# Patient Record
Sex: Male | Born: 1990 | Race: Black or African American | Hispanic: No | Marital: Married | Smoking: Former smoker
Health system: Southern US, Community
[De-identification: ages and names within clinical notes are randomized; demographics above are authoritative.]

## PROBLEM LIST (undated history)

## (undated) DIAGNOSIS — J45909 Unspecified asthma, uncomplicated: Secondary | ICD-10-CM

## (undated) HISTORY — PX: TONSILLECTOMY: SUR1361

## (undated) HISTORY — PX: ABDOMINAL SURGERY: SHX537

---

## 2011-11-16 ENCOUNTER — Emergency Department (HOSPITAL_COMMUNITY): Payer: Medicaid Other

## 2011-11-16 ENCOUNTER — Encounter (HOSPITAL_COMMUNITY): Payer: Self-pay

## 2011-11-16 ENCOUNTER — Emergency Department (HOSPITAL_COMMUNITY)
Admission: EM | Admit: 2011-11-16 | Discharge: 2011-11-16 | Disposition: A | Discharge status: Home / Self Care | Payer: Medicaid Other | Attending: Emergency Medicine | Admitting: Emergency Medicine

## 2011-11-16 DIAGNOSIS — X58XXXA Exposure to other specified factors, initial encounter: Secondary | ICD-10-CM | POA: Insufficient documentation

## 2011-11-16 DIAGNOSIS — M25519 Pain in unspecified shoulder: Secondary | ICD-10-CM | POA: Insufficient documentation

## 2011-11-16 DIAGNOSIS — Y9383 Activity, rough housing and horseplay: Secondary | ICD-10-CM | POA: Insufficient documentation

## 2011-11-16 DIAGNOSIS — M25511 Pain in right shoulder: Secondary | ICD-10-CM

## 2011-11-16 MED ORDER — KETOROLAC TROMETHAMINE 60 MG/2ML IM SOLN
60.0000 mg | Freq: Once | INTRAMUSCULAR | Status: AC
  Administered 2011-11-16: 60 mg via INTRAMUSCULAR
  Filled 2011-11-16: qty 2

## 2011-11-16 MED ORDER — OXYCODONE-ACETAMINOPHEN 5-325 MG PO TABS: 2.0000 | ORAL_TABLET | ORAL | Status: AC | PRN

## 2011-11-16 NOTE — Progress Notes (Signed)
Orthopedic Tech Progress Note Patient Details:  Paul Marshall [birth year] [id number]  Other Ortho Devices Ortho Device Location: immobilizer sling Ortho Device Interventions: Ordered   Shawnie Pons 11/16/2011, 5:02 PM

## 2011-11-16 NOTE — ED Notes (Signed)
Ortho tech paged for sling 
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
## 2013-05-21 ENCOUNTER — Encounter (HOSPITAL_COMMUNITY): Payer: Self-pay | Admitting: Emergency Medicine

## 2013-05-21 ENCOUNTER — Emergency Department (HOSPITAL_COMMUNITY): Payer: BC Managed Care – PPO

## 2013-05-21 ENCOUNTER — Emergency Department (HOSPITAL_COMMUNITY)
Admission: EM | Admit: 2013-05-21 | Discharge: 2013-05-21 | Disposition: A | Discharge status: Home / Self Care | Payer: BC Managed Care – PPO | Attending: Emergency Medicine | Admitting: Emergency Medicine

## 2013-05-21 DIAGNOSIS — Y9289 Other specified places as the place of occurrence of the external cause: Secondary | ICD-10-CM | POA: Insufficient documentation

## 2013-05-21 DIAGNOSIS — Y9367 Activity, basketball: Secondary | ICD-10-CM | POA: Insufficient documentation

## 2013-05-21 DIAGNOSIS — S62623A Displaced fracture of medial phalanx of left middle finger, initial encounter for closed fracture: Secondary | ICD-10-CM

## 2013-05-21 DIAGNOSIS — W230XXA Caught, crushed, jammed, or pinched between moving objects, initial encounter: Secondary | ICD-10-CM | POA: Insufficient documentation

## 2013-05-21 DIAGNOSIS — F17200 Nicotine dependence, unspecified, uncomplicated: Secondary | ICD-10-CM | POA: Insufficient documentation

## 2013-05-21 DIAGNOSIS — IMO0002 Reserved for concepts with insufficient information to code with codable children: Secondary | ICD-10-CM | POA: Insufficient documentation

## 2013-05-21 MED ORDER — IBUPROFEN 400 MG PO TABS
800.0000 mg | ORAL_TABLET | Freq: Once | ORAL | Status: AC
Start: 2013-05-21 — End: 2013-05-21
  Administered 2013-05-21: 800 mg via ORAL
  Filled 2013-05-21: qty 2

## 2013-05-21 MED ORDER — OXYCODONE-ACETAMINOPHEN 5-325 MG PO TABS: 1.0000 | ORAL_TABLET | Freq: Three times a day (TID) | ORAL | Status: DC | PRN

## 2013-05-21 NOTE — ED Provider Notes (Signed)
History  This chart was scribed for Glade Nurse, PA-C working with Enid Skeens, MD by Greggory Stallion, ED scribe. This patient was seen in room TR05C/TR05C and the patient's care was started at 3:02 PM.  CSN: [id number] Arrival date & time 05/21/13  1348   Chief Complaint  Patient presents with  . Finger Injury   The history is provided by the patient. No language interpreter was used.    HPI Comments: Paul Marshall is a 22 y.o. male who presents to the Emergency Department complaining of acute onset left middle finger injury with associated throbbing finger pain that happened yesterday while he was playing basketball. He rates his pain 6/10. Worse with movement. Pt states he jammed it and heard a crunch. He states he started playing more after it happened then it started hurting more. Pt denies any other associated symptoms. Pt states he put ice on his finger with some relief. Admits decreased ROM secondary to pain, but still able to move. No loss of sensation, no numbness, no fever/chills.   History reviewed. No pertinent past medical history. Past Surgical History  Procedure Laterality Date  . Abdominal surgery     History reviewed. No pertinent family history. History  Substance Use Topics  . Smoking status: Current Every Day Smoker  . Smokeless tobacco: Not on file  . Alcohol Use: Yes    Review of Systems  Constitutional: Negative for fever and diaphoresis.  HENT: Negative for neck pain and neck stiffness.   Eyes: Negative for visual disturbance.  Respiratory: Negative for apnea, chest tightness and shortness of breath.   Cardiovascular: Negative for chest pain and palpitations.  Gastrointestinal: Negative for nausea, vomiting, diarrhea and constipation.  Genitourinary: Negative for dysuria.  Musculoskeletal: Positive for arthralgias. Negative for gait problem.       Third finger of left hand, swelling  Skin: Negative for rash.  Neurological: Negative for dizziness,  weakness, light-headedness, numbness and headaches.    Allergies  Bactrim  Home Medications  No current outpatient prescriptions on file.  BP 134/65  Pulse 73  Temp(Src) 97.4 F (36.3 C) (Oral)  Resp 18  SpO2 99% Physical Exam  Nursing note and vitals reviewed. Constitutional: He is oriented to person, place, and time. He appears well-developed and well-nourished. No distress.  HENT:  Head: Normocephalic and atraumatic.  Eyes: Conjunctivae and EOM are normal.  Neck: Normal range of motion. Neck supple.  No meningeal signs  Cardiovascular: Normal rate, regular rhythm, normal heart sounds and intact distal pulses.  Exam reveals no gallop and no friction rub.   No murmur heard. Pulmonary/Chest: Effort normal and breath sounds normal. No respiratory distress. He has no wheezes. He has no rales. He exhibits no tenderness.  Abdominal: Soft. There is no tenderness.  Musculoskeletal: Normal range of motion. He exhibits no edema and no tenderness.  FROM to upper and lower extremities Full ROM to all digits on left hand Swelling, no bruising, not warm to touch.   Neurological: He is alert and oriented to person, place, and time. No cranial nerve deficit.  Sensation normal to light touch and two point discrimination Moves extremities without ataxia, coordination intact Normal gait and balance Normal strength in upper and lower extremities bilaterally including dorsiflexion and plantar flexion, strong and equal grip strength   Skin: Skin is warm and dry. He is not diaphoretic. No erythema.  Psychiatric: He has a normal mood and affect.    ED Course  Procedures (including critical care time)  DIAGNOSTIC STUDIES: Oxygen Saturation is RA% on normal, by my interpretation.    COORDINATION OF CARE: 3:09 PM-Discussed treatment plan which includes splint and ibuprofen with pt at bedside and pt agreed to plan. Advised pt to follow up with a hand specialist and PCP.   Labs Reviewed - No  data to display Dg Finger Middle Left  05/21/2013   *RADIOLOGY REPORT*  Clinical Data: Left middle finger injury.  LEFT MIDDLE FINGER 2+V  Comparison: None.  Findings: Small fracture off the base of the left middle finger middle phalanx proximally.  Mild overlying soft tissue swelling. No additional acute bony abnormality.  IMPRESSION: Small avulsion fracture at the base of the middle phalanx of the left middle finger.   Original Report Authenticated By: Charlett Nose, M.D.   1. Fracture of third finger, middle phalanx, left, closed, initial encounter     MDM  Imaging shows closed fracture of middle phalanx of the third finger of the left hand. Finger splint applied and ibuprofen given. Hand specialist follow up. Pt without PCP/insurance. Provided resource list and info on Affordable Care Act. Discussed pain management including ibuprofen and percocet for breakthrough pain. Discussed reasons to seek immediate care. Patient expresses understanding and agrees with plan.    I personally performed the services described in this documentation, which was scribed in my presence. The recorded information has been reviewed and is accurate.    Glade Nurse, PA-C 05/21/13 [phone]

## 2013-05-21 NOTE — Progress Notes (Signed)
Orthopedic Tech Progress Note Patient Details:  [name] [birth year] [id number]  Ortho Devices Type of Ortho Device: Finger splint Ortho Device/Splint Interventions: Application   Cammer, Mickie Bail 05/21/2013, 3:55 PM

## 2013-05-21 NOTE — ED Notes (Addendum)
Pt sts injured left middle finger while playing basketball yesterday; swelling noted

## 2013-05-21 NOTE — Discharge Instructions (Signed)
1. Follow up with the hand specialist, Dr. Janee Morn. Call his office to let him know you were seen in the ED for a small avulsion fracture (closed) at the base of the middle phalanx of your left middle finger and would like an appointment to follow up.   2. Establish a relationship with a primary care physician (see resource list below)  3. You may take up to 800 mg of ibuprofen (this is typically 4 over the counter pills) up to three times a day for your pain. For breakthrough pain you may take your prescribed Percocet. Do not take other medicines containing acetaminophen as Percocet contains acetaminophen (read the labels!).  Do not drink alcohol, drive, or operate heavy machinery while taking Percocet.   4. Leave the splint in place. Cover the cast or splint with a plastic bag during baths or wet weather.  5. SEEK MEDICAL CARE IF:  Pain, tenderness, or swelling gets worse, despite treatment.  You experience pain, numbness, or coldness in the hand.  Blue, gray, or dark color appears in the fingernails.  Any of the following occur after surgery: fever, increased pain, swelling, redness, drainage of fluids, or bleeding in the affected area.  New, unexplained symptoms develop. (Drugs used in treatment may produce side effects.)   Establish relationship with primary care doctor as discussed. A resource guide and information on the Affordable Care Act has been provided for your information.    RESOURCE GUIDE  If you do not have a primary care doctor to follow up with regarding today's visit, please call the [facility] at [phone] to make an appointment. Hours of operation are 10am - 7pm, Monday through Friday, and they have a sliding scale fee.   Insufficient Money for Medicine: Contact [employer]:  call "211" or [facility] [phone].  No Primary Care Doctor: - Call [facility]  [phone] - can help you locate a primary care doctor that  accepts your  insurance, provides certain services, etc. - Physician Referral [phone]  Agencies that provide inexpensive medical care: - [facility]  [phone] - [facility]  [phone] - [facility]  [phone] [facility]  [phone] - [facility]  [phone] [facility]  [phone]  Medicaid-accepting [facility] Providers: - [facility]- [address]  [phone], Mon-Fri 9am-7pm, Sat 9am-1pm - [facility]- [address] [place], [address] [place]  [phone] - [facility]- [address], [address] [place]  [phone] [facility]- [address]  [phone] - Renaye Rakers- [address] [place], [address], [phone]  Only accepts [place] patients after they have their name  applied to their card  Self Pay (no insurance) in [place]: - Sickle Cell Patients: Dr Willey Blade, [facility]  [address] [place], [phone] - [facility]- [address] [place]  [phone]       [facility] [place]- [address]       -     [facility]- see information above (Speak to [employer] if you do not have insurance)       -  [facility]- [address] [place], [phone]       -  [facility]- [address],  [phone]       -  [facility]- [address], [phone]       -  Dr Julio Sicks-  [address], [place], [phone]       -  [facility]- [address], [phone]       -  [facility]- [address] [phone], also [address] [phone]       -    [facility]- [address] [place], [phone], 1st & 3rd Saturday   every month, 10am-1pm  1) Find a Doctor and Pay Out of Pocket Although you won't have to find out who is covered by your insurance plan, it is a good idea to ask  around and get recommendations. You will then need to call the office and see if the doctor you have chosen will accept you as a new patient and what types of options they offer for patients who are self-pay. Some doctors offer discounts or will set up payment plans for their patients who do not have insurance, but you will need to ask so you aren't surprised when you get to your appointment.  2) Contact Your Local Health Department Not all health departments have doctors that can see patients for sick visits, but many do, so it is worth a call to see if yours does. If you don't know where your local health department is, you can check in your phone book. The CDC also has a tool to help you locate your state's health department, and many state websites also have listings of all of their local health departments.  3) Find a Walk-in Clinic If your illness is not likely to be very severe or complicated, you may want to try a walk in clinic. These are popping up all over the country in pharmacies, drugstores, and shopping centers. They're usually staffed by nurse practitioners or physician assistants that have been trained to treat common illnesses and complaints. They're usually fairly quick and inexpensive. However, if you have serious medical issues or chronic medical problems, these are probably not your best option

## 2013-05-23 NOTE — ED Provider Notes (Signed)
Medical screening examination/treatment/procedure(s) were performed by non-physician practitioner and as supervising physician I was immediately available for consultation/collaboration.  Abri Vacca M [place] Souter, MD 05/23/13 0112 

## 2013-07-20 ENCOUNTER — Emergency Department (HOSPITAL_COMMUNITY)
Admission: EM | Admit: 2013-07-20 | Discharge: 2013-07-20 | Disposition: A | Discharge status: Home / Self Care | Payer: BC Managed Care – PPO | Source: Home / Self Care

## 2013-07-20 ENCOUNTER — Encounter (HOSPITAL_COMMUNITY): Payer: Self-pay | Admitting: *Deleted

## 2013-07-20 DIAGNOSIS — Z720 Tobacco use: Secondary | ICD-10-CM

## 2013-07-20 DIAGNOSIS — J309 Allergic rhinitis, unspecified: Secondary | ICD-10-CM

## 2013-07-20 DIAGNOSIS — J410 Simple chronic bronchitis: Secondary | ICD-10-CM

## 2013-07-20 DIAGNOSIS — J302 Other seasonal allergic rhinitis: Secondary | ICD-10-CM

## 2013-07-20 MED ORDER — FLUTICASONE PROPIONATE 50 MCG/ACT NA SUSP: 2.0000 | Freq: Two times a day (BID) | NASAL | Status: DC

## 2013-07-20 MED ORDER — AZITHROMYCIN 250 MG PO TABS: ORAL_TABLET | ORAL | Status: DC

## 2013-07-20 MED ORDER — FEXOFENADINE HCL 180 MG PO TABS: 180.0000 mg | ORAL_TABLET | Freq: Every day | ORAL | Status: DC

## 2013-07-20 NOTE — ED Provider Notes (Signed)
CSN: [id number]     Arrival date & time 07/20/13  1415 History   None    Chief Complaint  Patient presents with  . URI   (Consider location/radiation/quality/duration/timing/severity/associated sxs/prior Treatment) Patient is a 22 y.o. male presenting with URI. The history is provided by the patient.  URI Presenting symptoms: congestion, cough and rhinorrhea   Presenting symptoms: no fever   Severity:  Mild Onset quality:  Gradual Duration:  2 days Progression:  Unchanged Chronicity:  New Associated symptoms: sneezing     History reviewed. No pertinent past medical history. Past Surgical History  Procedure Laterality Date  . Abdominal surgery     History reviewed. No pertinent family history. History  Substance Use Topics  . Smoking status: Current Every Day Smoker  . Smokeless tobacco: Not on file  . Alcohol Use: Yes    Review of Systems  Constitutional: Negative.  Negative for fever.  HENT: Positive for congestion, rhinorrhea and sneezing.   Respiratory: Positive for cough.     Allergies  Bactrim  Home Medications   Current Outpatient Rx  Name  Route  Sig  Dispense  Refill  . azithromycin (ZITHROMAX Z-PAK) 250 MG tablet      Take as directed on pack   6 each   0   . fexofenadine (ALLEGRA) 180 MG tablet   Oral   Take 1 tablet (180 mg total) by mouth daily.   30 tablet   1   . fluticasone (FLONASE) 50 MCG/ACT nasal spray   Nasal   Place 2 sprays into the nose 2 (two) times daily.   1 g   2   . oxyCODONE-acetaminophen (PERCOCET/ROXICET) 5-325 MG per tablet   Oral   Take 1 tablet by mouth every 8 (eight) hours as needed for pain.   6 tablet   0    BP 114/72  Pulse 67  Temp(Src) 99 F (37.2 C) (Oral)  Resp 18  SpO2 100% Physical Exam  Nursing note and vitals reviewed. Constitutional: He is oriented to person, place, and time. He appears well-developed and well-nourished.  HENT:  Head: Normocephalic.  Right Ear: External ear normal.  Left  Ear: External ear normal.  Nose: Mucosal edema and rhinorrhea present.  Mouth/Throat: Oropharynx is clear and moist.  Eyes: Conjunctivae are normal. Pupils are equal, round, and reactive to light.  Neck: Normal range of motion. Neck supple.  Cardiovascular: Normal rate and regular rhythm.   Pulmonary/Chest: Effort normal and breath sounds normal.  Lymphadenopathy:    He has no cervical adenopathy.  Neurological: He is alert and oriented to person, place, and time.  Skin: Skin is warm and dry.    ED Course  Procedures (including critical care time) Labs Review Labs Reviewed - No data to display Imaging Review No results found.  MDM      Linna Hoff, MD 07/20/13 1520

## 2013-07-20 NOTE — ED Notes (Signed)
Pt  Reports  Symptoms  Of   Nasal    Congested      Sneezing     Sinus  Drainage               X  sev  Days  With  The  Symptoms  unreleived  By otc  meds       She  Is  Sitting  Upright on  Exam table speaking in  Complete  sentances

## 2014-04-30 ENCOUNTER — Encounter (HOSPITAL_COMMUNITY): Payer: Self-pay | Admitting: Emergency Medicine

## 2014-04-30 ENCOUNTER — Emergency Department (HOSPITAL_COMMUNITY)
Admission: EM | Admit: 2014-04-30 | Discharge: 2014-04-30 | Disposition: A | Discharge status: Home / Self Care | Payer: BC Managed Care – PPO | Attending: Emergency Medicine | Admitting: Emergency Medicine

## 2014-04-30 ENCOUNTER — Emergency Department (HOSPITAL_COMMUNITY): Payer: BC Managed Care – PPO

## 2014-04-30 DIAGNOSIS — M25562 Pain in left knee: Secondary | ICD-10-CM

## 2014-04-30 DIAGNOSIS — S8990XA Unspecified injury of unspecified lower leg, initial encounter: Secondary | ICD-10-CM | POA: Insufficient documentation

## 2014-04-30 DIAGNOSIS — Y92838 Other recreation area as the place of occurrence of the external cause: Secondary | ICD-10-CM

## 2014-04-30 DIAGNOSIS — Y9239 Other specified sports and athletic area as the place of occurrence of the external cause: Secondary | ICD-10-CM | POA: Insufficient documentation

## 2014-04-30 DIAGNOSIS — Y9367 Activity, basketball: Secondary | ICD-10-CM | POA: Insufficient documentation

## 2014-04-30 DIAGNOSIS — S99929A Unspecified injury of unspecified foot, initial encounter: Principal | ICD-10-CM

## 2014-04-30 DIAGNOSIS — F17200 Nicotine dependence, unspecified, uncomplicated: Secondary | ICD-10-CM | POA: Insufficient documentation

## 2014-04-30 DIAGNOSIS — S99919A Unspecified injury of unspecified ankle, initial encounter: Principal | ICD-10-CM

## 2014-04-30 DIAGNOSIS — X500XXA Overexertion from strenuous movement or load, initial encounter: Secondary | ICD-10-CM | POA: Insufficient documentation

## 2014-04-30 NOTE — ED Provider Notes (Signed)
CSN: [id number]     Arrival date & time 04/30/14  1643 History  This chart was scribed for Paul SilkHannah Vina Byrd, PA-C working with Paul Creasehristopher J. Pollina, MD by Paul Marshall, ED Scribe. This patient was seen in room TR07C/TR07C and the patient's care was started at 6:19 PM.      Chief Complaint  Patient presents with  . Knee Injury   The history is provided by the patient. No language interpreter was used.  HPI Comments: Paul Marshall is a 23 y.o. male who presents to the Emergency Department complaining of left knee injury onset 2 days prior. He states he was playing basketball and may have hyperextended his knee. He denies any other injuries during this time. He states his pain worsen when ambulating. He has not injured this knee in the past. He is here today because his pain is not better. He denies taking any medication prior to arrival.    History reviewed. No pertinent past medical history. Past Surgical History  Procedure Laterality Date  . Abdominal surgery     History reviewed. No pertinent family history. History  Substance Use Topics  . Smoking status: Current Every Day Smoker  . Smokeless tobacco: Not on file  . Alcohol Use: Yes    Review of Systems  Musculoskeletal: Positive for arthralgias.  All other systems reviewed and are negative.   Allergies  Bactrim  Home Medications   Prior to Admission medications   Not on File   Triage Vitals: BP 136/84  Pulse 52  Temp(Src) 97.8 F (36.6 C) (Oral)  Resp 16  Ht 5\' 10"  (1.778 m)  Wt 175 lb (79.379 kg)  BMI 25.11 kg/m2  SpO2 100%  Physical Exam  Nursing note and vitals reviewed. Constitutional: He is oriented to person, place, and time. He appears well-developed and well-nourished. No distress.  HENT:  Head: Normocephalic and atraumatic.  Right Ear: External ear normal.  Left Ear: External ear normal.  Nose: Nose normal.  Eyes: Conjunctivae are normal.  Neck: Normal range of motion. No tracheal deviation  present.  Cardiovascular: Normal rate, regular rhythm, normal heart sounds, intact distal pulses and normal pulses.   Pulses:      Dorsalis pedis pulses are 2+ on the right side, and 2+ on the left side.  Pulmonary/Chest: Effort normal and breath sounds normal. No stridor.  Abdominal: Soft. He exhibits no distension. There is no tenderness.  Musculoskeletal: Normal range of motion. He exhibits tenderness.  Tenderness to palpation along lateral joint line.  Patient guards patella. He is able to straight leg raise.  Joint is stable. Neurovascularly intact, compartment soft  Neurological: He is alert and oriented to person, place, and time.  Skin: Skin is warm and dry. He is not diaphoretic.  Psychiatric: He has a normal mood and affect. His behavior is normal.    ED Course  Procedures (including critical care time) DIAGNOSTIC STUDIES: Oxygen Saturation is 100% on RA, normal by my interpretation.    COORDINATION OF CARE:    Labs Review Labs Reviewed - No data to display  Imaging Review Dg Knee Complete 4 Views Left  04/30/2014   CLINICAL DATA:  Basketball injury with pain in the left knee.  EXAM: LEFT KNEE - COMPLETE 4+ VIEW  COMPARISON:  None.  FINDINGS: No joint effusion or fracture.  No degenerative changes.  IMPRESSION: No acute osseous or joint abnormality.   Electronically Signed   By: Paul BattlesMelinda  Marshall M.D.   On: 04/30/2014 18:06  EKG Interpretation None      MDM   Final diagnoses:  Left knee pain   Patient presents to ED with persistent left knee pain after injury 2 days ago. XR shows no acute osseous or joint abnormality. Joint is stable. There is no effusion. Neurovascularly intact. Compartment soft. Given the amount of guarding patient had with his patella I will give him knee immobilizer and crutches. Encouraged patient to follow up with orthopedics. Return instructions given. Vital signs stable for discharge. Patient / Family / Caregiver informed of clinical  course, understand medical decision-making process, and agree with plan.   I personally performed the services described in this documentation, which was scribed in my presence. The recorded information has been reviewed and is accurate.    Paul BellmanHannah S Kassius Battiste, PA-C 04/30/14 2311

## 2014-04-30 NOTE — ED Notes (Signed)
Pt to xray

## 2014-04-30 NOTE — Discharge Instructions (Signed)

## 2014-04-30 NOTE — ED Notes (Signed)
Pt in c/o injury to left knee two days ago while playing ball, states he twisted it, ambulatory with increased pain, no distress noted

## 2014-05-01 NOTE — ED Provider Notes (Signed)
Medical screening examination/treatment/procedure(s) were performed by non-physician practitioner and as supervising physician I was immediately available for consultation/collaboration.   EKG Interpretation None        Zarin Knupp J. Mustafa Potts, MD 05/01/14 0009 

## 2015-01-15 ENCOUNTER — Emergency Department (HOSPITAL_COMMUNITY)
Admission: EM | Admit: 2015-01-15 | Discharge: 2015-01-15 | Disposition: A | Discharge status: Home / Self Care | Payer: Self-pay | Attending: Emergency Medicine | Admitting: Emergency Medicine

## 2015-01-15 ENCOUNTER — Encounter (HOSPITAL_COMMUNITY): Payer: Self-pay | Admitting: Emergency Medicine

## 2015-01-15 DIAGNOSIS — Z9889 Other specified postprocedural states: Secondary | ICD-10-CM | POA: Insufficient documentation

## 2015-01-15 DIAGNOSIS — Z720 Tobacco use: Secondary | ICD-10-CM | POA: Insufficient documentation

## 2015-01-15 DIAGNOSIS — F419 Anxiety disorder, unspecified: Secondary | ICD-10-CM | POA: Insufficient documentation

## 2015-01-15 DIAGNOSIS — R109 Unspecified abdominal pain: Secondary | ICD-10-CM

## 2015-01-15 DIAGNOSIS — R110 Nausea: Secondary | ICD-10-CM | POA: Insufficient documentation

## 2015-01-15 MED ORDER — DICYCLOMINE HCL 20 MG PO TABS: 20.0000 mg | ORAL_TABLET | Freq: Two times a day (BID) | ORAL | Status: DC

## 2015-01-15 MED ORDER — DICYCLOMINE HCL 10 MG PO CAPS
20.0000 mg | ORAL_CAPSULE | Freq: Once | ORAL | Status: AC
  Administered 2015-01-15: 20 mg via ORAL
  Filled 2015-01-15: qty 2

## 2015-01-15 NOTE — ED Notes (Signed)
Pt c/o nausea and "bublling" in stomach x 1 week; pt sts not sleeping well

## 2015-01-15 NOTE — Discharge Instructions (Signed)
Nausea, Adult Nausea is the feeling that you have an upset stomach or have to vomit. Nausea by itself is not likely a serious concern, but it may be an early sign of more serious medical problems. As nausea gets worse, it can lead to vomiting. If vomiting develops, there is the risk of dehydration.  CAUSES   Viral infections.  Food poisoning.  Medicines.  Pregnancy.  Motion sickness.  Migraine headaches.  Emotional distress.  Severe pain from any source.  Alcohol intoxication. HOME CARE INSTRUCTIONS  Get plenty of rest.  Ask your caregiver about specific rehydration instructions.  Eat small amounts of food and sip liquids more often.  Take all medicines as told by your caregiver. SEEK MEDICAL CARE IF:  You have not improved after 2 days, or you get worse.  You have a headache. SEEK IMMEDIATE MEDICAL CARE IF:   You have a fever.  You faint.  You keep vomiting or have blood in your vomit.  You are extremely weak or dehydrated.  You have dark or bloody stools.  You have severe chest or abdominal pain. MAKE SURE YOU:  Understand these instructions.  Will watch your condition.  Will get help right away if you are not doing well or get worse. Document Released: 11/24/2004 Document Revised: 07/11/2012 Document Reviewed: 06/29/2011 [facility] Patient Information 2015 [place], [place]. This information is not intended to replace advice given to you by your health care provider. Make sure you discuss any questions you have with your health care provider.  Generalized Anxiety  Generalized anxiety disorder (GAD) is a mental disorder. It interferes with life functions, including relationships, work, and school. GAD is different from normal anxiety, which everyone experiences at some point in their lives in response to specific life events and activities. Normal anxiety actually helps [place] prepare for and get through these life events and activities. Normal anxiety goes away  after the event or activity is over.  GAD causes anxiety that is not necessarily related to specific events or activities. It also causes excess anxiety in proportion to specific events or activities. The anxiety associated with GAD is also difficult to control. GAD can vary from mild to severe. People with severe GAD can have intense waves of anxiety with physical symptoms (panic attacks).  SYMPTOMS The anxiety and worry associated with GAD are difficult to control. This anxiety and worry are related to many life events and activities and also occur more days than not for 6 months or longer. People with GAD also have three or more of the following symptoms (one or more in children):  Restlessness.   Fatigue.  Difficulty concentrating.   Irritability.  Muscle tension.  Difficulty sleeping or unsatisfying sleep. DIAGNOSIS GAD is diagnosed through an assessment by your health care provider. Your health care provider will ask you questions aboutyour mood,physical symptoms, and events in your life. Your health care provider may ask you about your medical history and use of alcohol or drugs, including prescription medicines. Your health care provider may also do a physical exam and blood tests. Certain medical conditions and the use of certain substances can cause symptoms similar to those associated with GAD. Your health care provider may refer you to a mental health specialist for further evaluation. TREATMENT The following therapies are usually used to treat GAD:   Medication. Antidepressant medication usually is prescribed for long-term daily control. Antianxiety medicines may be added in severe cases, especially when panic attacks occur.   Talk therapy (psychotherapy). Certain types  of talk therapy can be helpful in treating GAD by providing support, education, and guidance. A form of talk therapy called cognitive behavioral therapy can teach you healthy ways to think about and react to  daily life events and activities.  Stress managementtechniques. These include yoga, meditation, and exercise and can be very helpful when they are practiced regularly. A mental health specialist can help determine which treatment is best for you. Some people see improvement with one therapy. However, other people require a combination of therapies. Document Released: 02/11/2013 Document Revised: 03/03/2014 Document Reviewed: 02/11/2013 [facility] Patient Information 2015 [place]. This information is not intended to replace advice given to you by your health care provider. Make sure you discuss any questions you have with your health care provider.

## 2015-01-15 NOTE — ED Provider Notes (Signed)
CSN: [id number]     Arrival date & time 01/15/15  1251 History   This chart was scribed for non-physician practitioner, Everlene FarrierWilliam Khayree Delellis, PA-C working with Mancel BaleElliott Wentz, MD, by Abel PrestoKara Demonbreun, ED Scribe. This patient was seen in room TR09C/TR09C and the patient's care was started at 4:14 PM.       Chief Complaint  Patient presents with  . Nausea    The history is provided by the patient. No language interpreter was used.   HPI Comments: Paul Marshall is a 24 y.o. male who presents to the Emergency Department complaining of abdominal discomfort, described as "bubbling" with onset a week ago. Pt notes associated nausea. Pt reports normal bowel movements; last BM was yesterday.  Pt states he has been eating and drinking normally. He reports having difficulty sleeping. He has recent stressors and notes associated anxiety. Pt has not taken any medications for relief. [facility] and has been feeling anxious. Pt denies diarrhea, dysuria, vomiting, hematuria, urinary frequency, hematochezia, body aches, sore throat, congestion, cough, SOB, wheezing, ear pain, eye pain, testicular pain, scrotal swelling,  rash, fever, and chills. Pt does not have a PCP.   History reviewed. No pertinent past medical history. Past Surgical History  Procedure Laterality Date  . Abdominal surgery     History reviewed. No pertinent family history. History  Substance Use Topics  . Smoking status: Current Every Day Smoker  . Smokeless tobacco: Not on file  . Alcohol Use: Yes    Review of Systems  Constitutional: Negative for fever and chills.  HENT: Negative for congestion, ear pain and sore throat.   Eyes: Negative for pain.  Respiratory: Negative for cough, shortness of breath and wheezing.   Gastrointestinal: Positive for nausea. Negative for vomiting, abdominal pain, diarrhea, constipation and blood in stool.  Genitourinary: Negative for dysuria, frequency, hematuria, scrotal swelling and  testicular pain.  Musculoskeletal: Negative for myalgias.  Skin: Negative for rash.  Psychiatric/Behavioral: The patient is nervous/anxious.     Allergies  Bactrim  Home Medications   Prior to Admission medications   Medication Sig Start Date End Date Taking? Authorizing Provider  dicyclomine (BENTYL) 20 MG tablet Take 1 tablet (20 mg total) by mouth 2 (two) times daily. 01/15/15   Everlene FarrierWilliam Khoury Siemon, PA-C   BP 142/89 mmHg  Pulse 88  Temp(Src) 97.7 F (36.5 C) (Oral)  Resp 16  SpO2 100% Physical Exam  Constitutional: He is oriented to person, place, and time. He appears well-developed and well-nourished. No distress.  HENT:  Head: Normocephalic and atraumatic.  Mouth/Throat: Oropharynx is clear and moist. No oropharyngeal exudate.  Eyes: Conjunctivae are normal. Pupils are equal, round, and reactive to light.  Neck: Normal range of motion. Neck supple. No JVD present.  Cardiovascular: Normal rate, regular rhythm and normal heart sounds.  Exam reveals no friction rub.   No murmur heard. Pulmonary/Chest: Effort normal and breath sounds normal. No respiratory distress. He has no wheezes. He has no rales.  Abdominal: Soft. Bowel sounds are normal. He exhibits no distension and no mass. There is no tenderness. There is no rebound and no guarding.  Abdomen is soft and nontender to palpation. Bowel sounds are present. No McBurney's point tenderness. Negative Murphy's sign. Negative Rovsing sign. Negative psoas and obturator sign.  Musculoskeletal: Normal range of motion.  Lymphadenopathy:    He has no cervical adenopathy.  Neurological: He is alert and oriented to person, place, and time. Coordination normal.  Skin: Skin is  warm and dry. No rash noted. He is not diaphoretic. No erythema. No pallor.  Psychiatric: He has a normal mood and affect. His behavior is normal.  He endorses anxiety and appears slightly anxious.   Nursing note and vitals reviewed.   ED Course  Procedures  (including critical care time) DIAGNOSTIC STUDIES: Oxygen Saturation is 100% on room air, normal by my interpretation.    COORDINATION OF CARE: 4:22 PM Discussed treatment plan with patient at beside, the patient agrees with the plan and has no further questions at this time.   Labs Review Labs Reviewed - No data to display  Imaging Review No results found.   EKG Interpretation None      Filed Vitals:   01/15/15 1315 01/15/15 1614  BP: 144/76 142/89  Pulse: 72 88  Temp: 97.9 F (36.6 C) 97.7 F (36.5 C)  TempSrc: Oral Oral  Resp: 18 16  SpO2: 100% 100%     MDM   Meds given in ED:  Medications  dicyclomine (BENTYL) capsule 20 mg (20 mg Oral Given 01/15/15 1630)    There are no discharge medications for this patient.   Final diagnoses:  Abdominal discomfort   This is a 24 year old male who presents the emergency department complaining of bubbling in his stomach for the past week. The patient reports feeling anxious about leaving for the [employer] [facility] soon. He reports having some trouble falling asleep because his mind is racing. Patient denies nausea, abdominal pain, vomiting, diarrhea, abdominal surgeries, fevers, chills or rashes. The patient is afebrile and nontoxic appearing. Patient's abdomen is soft and nontender to palpation. The patient's abdominal discomfort is likely related to his anxious feelings. Will by the patient with Bentyl and have him follow-up with his primary care provider for further. I advised the patient to follow-up with their primary care provider this week. I advised the patient to return to the emergency department with new or worsening symptoms or new concerns. The patient verbalized understanding and agreement with plan.   I personally performed the services described in this documentation, which was scribed in my presence. The recorded information has been reviewed and is accurate.     Everlene Farrier, PA-C 01/15/15 1734  Mancel Bale,  MD 01/16/15 [phone]

## 2015-01-27 ENCOUNTER — Encounter (HOSPITAL_COMMUNITY): Payer: Self-pay | Admitting: Emergency Medicine

## 2015-01-27 ENCOUNTER — Emergency Department (HOSPITAL_COMMUNITY)
Admission: EM | Admit: 2015-01-27 | Discharge: 2015-01-27 | Disposition: A | Discharge status: Home / Self Care | Payer: Self-pay | Attending: Emergency Medicine | Admitting: Emergency Medicine

## 2015-01-27 DIAGNOSIS — K0889 Other specified disorders of teeth and supporting structures: Secondary | ICD-10-CM

## 2015-01-27 DIAGNOSIS — K0381 Cracked tooth: Secondary | ICD-10-CM | POA: Insufficient documentation

## 2015-01-27 DIAGNOSIS — K088 Other specified disorders of teeth and supporting structures: Secondary | ICD-10-CM | POA: Insufficient documentation

## 2015-01-27 DIAGNOSIS — Z79899 Other long term (current) drug therapy: Secondary | ICD-10-CM | POA: Insufficient documentation

## 2015-01-27 DIAGNOSIS — Z720 Tobacco use: Secondary | ICD-10-CM | POA: Insufficient documentation

## 2015-01-27 MED ORDER — NAPROXEN 500 MG PO TABS: 500.0000 mg | ORAL_TABLET | Freq: Two times a day (BID) | ORAL | Status: DC

## 2015-01-27 MED ORDER — PENICILLIN V POTASSIUM 500 MG PO TABS
500.0000 mg | ORAL_TABLET | Freq: Four times a day (QID) | ORAL | Status: DC
Start: 2015-01-27 — End: 2015-03-31

## 2015-01-27 NOTE — ED Notes (Signed)
Pt sts right lower dental pain x 3 days

## 2015-01-27 NOTE — Discharge Instructions (Signed)
Take veetid as directed until gone. Take naprosyn as needed for pain. Refer to attached documents for more information. Follow up with the recommended dentist.

## 2015-01-27 NOTE — ED Notes (Signed)
Declined W/C at D/C and was escorted to lobby by RN. 

## 2015-01-27 NOTE — ED Provider Notes (Signed)
CSN: [id number]     Arrival date & time 01/27/15  1305 History  This chart was scribed for non-physician practitioner, Emilia Beck, PA-C working with Bethann Berkshire, MD by Luisa Dago, Medical Scribe. This patient was seen in room TR09C/TR09C and the patient's care was started at 2:02 PM.    Chief Complaint  Patient presents with  . Dental Pain   Patient is a 24 y.o. male presenting with tooth pain. The history is provided by the patient and medical records. No language interpreter was used.  Dental Pain Location:  Upper Quality:  Aching Severity:  Moderate Onset quality:  Sudden Duration:  12 hours Timing:  Constant Progression:  Worsening Chronicity:  New Relieved by:  Nothing Worsened by:  Touching Ineffective treatments:  None tried Associated symptoms: no facial swelling, no fever, no neck pain and no trismus    HPI Comments: Tavi Hoogendoorn is a 24 y.o. male who presents to the Emergency Department complaining of sudden onset right upper sided dental pain that started last night. Pt reports taking 600 mg Ibuprofen at 5AM without any adequate relief of his pain. He denies any fever, neck pain, sore throat, visual disturbance, CP, cough, SOB, abdominal pain, nausea, emesis, diarrhea, urinary symptoms, back pain, HA, weakness, numbness and rash as associated symptoms.    History reviewed. No pertinent past medical history. Past Surgical History  Procedure Laterality Date  . Abdominal surgery     History reviewed. No pertinent family history. History  Substance Use Topics  . Smoking status: Current Every Day Smoker  . Smokeless tobacco: Not on file  . Alcohol Use: Yes    Review of Systems  Constitutional: Negative for fever.  HENT: Positive for dental problem. Negative for facial swelling and sore throat.   Respiratory: Negative for shortness of breath.   Musculoskeletal: Negative for neck pain and neck stiffness.  All other systems reviewed and are  negative.     Allergies  Bactrim  Home Medications   Prior to Admission medications   Medication Sig Start Date End Date Taking? Authorizing Provider  dicyclomine (BENTYL) 20 MG tablet Take 1 tablet (20 mg total) by mouth 2 (two) times daily. 01/15/15   Everlene Farrier, PA-C   BP 134/80 mmHg  Pulse 57  Temp(Src) 98.4 F (36.9 C) (Oral)  Resp 16  SpO2 100%   Physical Exam  Constitutional: He is oriented to person, place, and time. He appears well-developed and well-nourished. No distress.  HENT:  Head: Normocephalic and atraumatic.  Fair dentition. Right upper premolar cracked and tender to percussion. No abscess noted.   Eyes: Conjunctivae and EOM are normal.  Neck: Neck supple.  Cardiovascular: Normal rate and regular rhythm.  Exam reveals no gallop and no friction rub.   No murmur heard. Pulmonary/Chest: Effort normal and breath sounds normal. No respiratory distress. He has no wheezes. He has no rales. He exhibits no tenderness.  Abdominal: Soft. There is no tenderness.  Musculoskeletal: Normal range of motion.  Neurological: He is alert and oriented to person, place, and time.  Speech is goal-oriented. Moves limbs without ataxia.   Skin: Skin is warm and dry.  Psychiatric: He has a normal mood and affect. His behavior is normal.  Nursing note and vitals reviewed.   ED Course  Procedures (including critical care time)  DIAGNOSTIC STUDIES: Oxygen Saturation is 100% on RA, normal by my interpretation.    COORDINATION OF CARE: 2:04 PM- Will d/c pt with referral to dentist on call. Pt advised  of plan for treatment and pt agrees.    MDM   Final diagnoses:  Pain, dental     No signs of ludwigs angina. Patient will have antibiotics and pain medication and dental referral. Vitals stable and patient afebrile. Patient instructed to return with worsening or concerning symptoms.   I personally performed the services described in this documentation, which was scribed  in my presence. The recorded information has been reviewed and is accurate.    [address]Bartholomew Ramesh RadissonSzekalski, PA-C 01/28/15 [phone]  Bethann BerkshireJoseph Zammit, MD 01/28/15 [phone]

## 2015-03-31 ENCOUNTER — Encounter (HOSPITAL_COMMUNITY): Payer: Self-pay | Admitting: Emergency Medicine

## 2015-03-31 ENCOUNTER — Emergency Department (HOSPITAL_COMMUNITY)
Admission: EM | Admit: 2015-03-31 | Discharge: 2015-03-31 | Disposition: A | Discharge status: Home / Self Care | Payer: Self-pay | Attending: Emergency Medicine | Admitting: Emergency Medicine

## 2015-03-31 DIAGNOSIS — K088 Other specified disorders of teeth and supporting structures: Secondary | ICD-10-CM | POA: Insufficient documentation

## 2015-03-31 DIAGNOSIS — Z720 Tobacco use: Secondary | ICD-10-CM | POA: Insufficient documentation

## 2015-03-31 DIAGNOSIS — K0889 Other specified disorders of teeth and supporting structures: Secondary | ICD-10-CM

## 2015-03-31 DIAGNOSIS — K0381 Cracked tooth: Secondary | ICD-10-CM | POA: Insufficient documentation

## 2015-03-31 DIAGNOSIS — K047 Periapical abscess without sinus: Secondary | ICD-10-CM | POA: Insufficient documentation

## 2015-03-31 MED ORDER — NAPROXEN 500 MG PO TABS: 500.0000 mg | ORAL_TABLET | Freq: Two times a day (BID) | ORAL | Status: DC

## 2015-03-31 MED ORDER — PENICILLIN V POTASSIUM 500 MG PO TABS: 500.0000 mg | ORAL_TABLET | Freq: Three times a day (TID) | ORAL | Status: DC

## 2015-03-31 MED ORDER — OXYCODONE-ACETAMINOPHEN 5-325 MG PO TABS
1.0000 | ORAL_TABLET | Freq: Once | ORAL | Status: AC
Start: 2015-03-31 — End: 2015-03-31
  Administered 2015-03-31: 1 via ORAL
  Filled 2015-03-31: qty 1

## 2015-03-31 NOTE — ED Provider Notes (Signed)
CSN: [id number]     Arrival date & time 03/31/15  2146 History  This chart was scribed for Paul Morn NP, working with Eber Hong, MD by Placido Sou, ED Scribe. This patient was seen in room TR06C/TR06C and the patient's care was started at 10:12 PM.      Chief Complaint  Patient presents with  . Dental Pain    The history is provided by the patient. No language interpreter was used.    HPI Comments: Keng Jewel is a 24 y.o. male who presents to the Emergency Department complaining of constant, moderate, pain on the right side of the mouth onset 1 month ago.. Pt notes that he has a decayed tooth and cyst on the right side of his mouth. The cyst spontaneously popped today and the pt is concerned about ingestion of the discharge. He also notes multiple decayed teeth on the left side. Pt notes he doesn't have a dental provider. He also notes an allergy to Bactrim. Pt notes that he has no history of DM. He denies trouble swallowing, fever, or chills.    History reviewed. No pertinent past medical history. Past Surgical History  Procedure Laterality Date  . Abdominal surgery     No family history on file. History  Substance Use Topics  . Smoking status: Current Every Day Smoker  . Smokeless tobacco: Not on file  . Alcohol Use: Yes    Review of Systems  Constitutional: Negative for fever and chills.  HENT: Positive for dental problem and mouth sores. Negative for trouble swallowing.   Respiratory: Negative for shortness of breath.   All other systems reviewed and are negative.     Allergies  Bactrim  Home Medications   Prior to Admission medications   Not on File   BP 138/81 mmHg  Pulse 104  Temp(Src) 98 F (36.7 C) (Oral)  Resp 16  Ht  (1.778 m)  Wt 185 lb (83.915 kg)  BMI 26.54 kg/m2  SpO2 99% Physical Exam  Constitutional: He is oriented to person, place, and time. He appears well-developed and well-nourished. No distress.  HENT:  Head:  Normocephalic and atraumatic.  Mouth/Throat: No trismus in the jaw. Dental abscesses present.  3rd upper tooth from the back bilaterally are broken Gum swelling along upper gumline with draining lesion  Eyes: Conjunctivae and EOM are normal.  Neck: Normal range of motion. Neck supple.  Cardiovascular: Normal rate, regular rhythm and normal heart sounds.   Pulmonary/Chest: Breath sounds normal. No respiratory distress.  Neurological: He is alert and oriented to person, place, and time.  Skin: Skin is warm and dry.  Psychiatric: He has a normal mood and affect. His behavior is normal.  Nursing note and vitals reviewed.   ED Course  Procedures  DIAGNOSTIC STUDIES: Oxygen Saturation is 99% on RA, normal by my interpretation.    COORDINATION OF CARE: 10:18 PM Discussed treatment plan with pt at bedside anti-inflammatories and pain medication. In addition a referral for a dental provider. Pt agreed to plan.  Labs Review Labs Reviewed - No data to display  Imaging Review No results found.   EKG Interpretation None      MDM   Final diagnoses:  None    Dental pain. Percocet x 1 in ED. Pen V-K. Anti-inflammatory. Dental follow-up.  Resource information provided. Return precautions discussed.  I personally performed the services described in this documentation, which was scribed in my presence. The recorded information has been reviewed and is accurate.  Paul Mornavid Dezerae Freiberger, NP 03/31/15 2310  Eber HongBrian Miller, MD 04/01/15 1017

## 2015-03-31 NOTE — ED Notes (Signed)
Pt reports dental abscess of R upper gum busted and also c.o L sided dental pain. Denies fevers/chills.

## 2015-03-31 NOTE — Discharge Instructions (Signed)
Dental Pain °A tooth ache may be caused by cavities (tooth decay). Cavities expose the nerve of the tooth to air and hot or cold temperatures. It may come from an infection or abscess (also called a boil or furuncle) around your tooth. It is also often caused by dental caries (tooth decay). This causes the pain you are having. °DIAGNOSIS  °Your caregiver can diagnose this problem by exam. °TREATMENT  °· If caused by an infection, it may be treated with medications which kill germs (antibiotics) and pain medications as prescribed by your caregiver. Take medications as directed. °· Only take over-the-counter or prescription medicines for pain, discomfort, or fever as directed by your caregiver. °· Whether the tooth ache today is caused by infection or dental disease, you should see your dentist as soon as possible for further care. °SEEK MEDICAL CARE IF: °The exam and treatment you received today has been provided on an emergency basis only. This is not a substitute for complete medical or dental care. If your problem worsens or new problems (symptoms) appear, and you are unable to meet with your dentist, call or return to this location. °SEEK IMMEDIATE MEDICAL CARE IF:  °· You have a fever. °· You develop redness and swelling of your face, jaw, or neck. °· You are unable to open your mouth. °· You have severe pain uncontrolled by pain medicine. °MAKE SURE YOU:  °· Understand these instructions. °· Will watch your condition. °· Will get help right away if you are not doing well or get worse. °Document Released: 10/17/2005 Document Revised: 01/09/2012 Document Reviewed: 06/04/2008 °ExitCare® Patient Information ©2015 [employer]. This information is not intended to replace advice given to you by your health care provider. Make sure you discuss any questions you have with your health care provider. ° °Emergency Department Resource Guide °1) Find a Doctor and Pay Out of Pocket °Although you won't have to find out who  is covered by your insurance plan, it is a good idea to ask around and get recommendations. You will then need to call the office and see if the doctor you have chosen will accept you as a new patient and what types of options they offer for patients who are self-pay. Some doctors offer discounts or will set up payment plans for their patients who do not have insurance, but you will need to ask so you aren't surprised when you get to your appointment. ° °2) Contact Your Local Health Department °Not all health departments have doctors that can see patients for sick visits, but many do, so it is worth a call to see if yours does. If you don't know where your local health department is, you can check in your phone book. The CDC also has a tool to help you locate your state's health department, and many state websites also have listings of all of their local health departments. ° °3) Find a Walk-in Clinic °If your illness is not likely to be very severe or complicated, you may want to try a walk in clinic. These are popping up all over the country in pharmacies, drugstores, and shopping centers. They're usually staffed by nurse practitioners or physician assistants that have been trained to treat common illnesses and complaints. They're usually fairly quick and inexpensive. However, if you have serious medical issues or chronic medical problems, these are probably not your best option. ° °No Primary Care Doctor: °- Call [facility] at  832-8000 - they can help you locate a primary   care doctor that  accepts your insurance, provides certain services, etc. °- Physician Referral Service- 1-800-533-3463 ° °Chronic Pain Problems: °Organization         Address  Phone   Notes  °[facility]  (336) 297-2271 Patients need to be referred by their primary care doctor.  ° °Medication Assistance: °Organization         Address  Phone   Notes  °[place] [facility] [address]  [address] °[place], [place] [address] (336) 641-8030 --Must be a resident of [place] °-- Must have NO insurance coverage whatsoever (no Medicaid/ Medicare, etc.) °-- The pt. MUST have a primary care doctor that directs their care regularly and follows them in the community °  °[facility]  (866) 331-1348   °[employer]  (888) 892-1162   ° °Agencies that provide inexpensive medical care: °Organization         Address  Phone   Notes  °[facility]  (336) 832-8035   °[facility]    (336) 832-7272   °[facility] [address] °[place], [place] [address] (336) 832-4777   °[facility] [address], °[place] (336) 271-4999   °[facility]    (336) 373-0678   °[facility]    (336) 272-1050   °[facility] ° [address], [place] Phone:  (336) 832-4444, Fax:  (336) 832-4440 Hours of Operation:  9 am - 6 pm, M-F.  Also accepts Medicaid/Medicare and self-pay.  °[facility] ° [address], [place] Phone: (336) 832-3150, Fax: (336) 832-3151. Hours of Operation:  8:30 am - 5:30 pm, M-F.  Also accepts Medicaid and self-pay.  °[facility] [place] [address], [place] Phone: (336) 878-6027   °[facility] [address], [place], [place] (336)723-1848, Ext. 123 Mondays & Thursdays: 7-9 AM.  First 15 patients are seen on a first come, first serve basis. °  ° °Medicaid-accepting [place] Providers: ° °Organization         Address  Phone   Notes  °[facility] [address], [place] (336) 641-2100 Also accepts self-pay patients.  °[facility] [address], [place] ° (336) 856-9996   °[facility] [address], [place] (336) 288-8857   °[facility] [address] [place] [address], [place] (336) 299-7000   °Veita Bland [address], [place]  ° (336) 373-1557 Only accepts [place] Access Medicaid patients after they have their name applied to their card.  ° °Self-Pay (no insurance) in [place]: ° °Organization         Address  Phone   Notes  °Sickle Cell Patients, [facility] [address], [place] (336) 832-1970   °[facility] [address], [place] (336) 832-4400   ° [facility] ° [address], [place] (336) 992-4800   °[facility]/Dr. Osei-Bonsu ° [address] [place] [address], [place] or [address], [place] (336) 841-8500 Phone number for both [place] and [place] locations is the same.  °[facility] [address], [place] (336) 299-0000   °[facility] [address] [place] [address],  or [address] (336) 852-7530 °(336) 878-2260   °  [facility] [address], [place] (336) 350-1642, phone; (336) 294-5005, fax Sees patients 1st and 3rd Saturday of every month.  Must not qualify for public or private insurance (i.e. Medicaid, Medicare, [place] Health Choice, Veterans' Benefits) • Household income should be no more than 200% of the poverty level •The clinic cannot treat you if you are pregnant or think you are pregnant • Sexually transmitted diseases are not treated at the clinic.  ° ° °Dental Care: °Organization         Address  Phone  Notes  °[facility] [address], [place] (336) 641-6152 Accepts children up to age 21 who are enrolled in Medicaid or [place] Health Choice; pregnant women with a Medicaid card; and children who have applied for Medicaid or [place] Health Choice, but were declined, whose parents can pay a reduced fee at time of service.  °[facility] [place]  [address], [place] (336) 641-7733 Accepts children up to age 21 who are  enrolled in Medicaid or [place] Health Choice; pregnant women with a Medicaid card; and children who have applied for Medicaid or [place] Health Choice, but were declined, whose parents can pay a reduced fee at time of service.  °[facility] ° [address], [place] (336) 641-4533 Patients are seen by appointment only. Walk-ins are not accepted. [facility] will see patients 18 years of age and older. °Monday - Tuesday (8am-5pm) °Most Wednesdays (8:30-5pm) °$30 per visit, cash only  °[facility] ° [address], [place] (336) 641-4533 Patients are seen by appointment only. Walk-ins are not accepted. [facility] will see patients 18 years of age and older. °One Wednesday Evening (Monthly: Volunteer Based).  $30 per visit, cash only  °[facility]  (919) 537-3737 for adults; Children under age 4, call Graduate Pediatric Dentistry at ([phone]) [phone]. Children aged 4-14, please call (919) [phone] to request a pediatric application. ° Dental services are provided in all areas of dental care including fillings, crowns and bridges, complete and partial dentures, implants, gum treatment, root canals, and extractions. Preventive care is also provided. Treatment is provided to both adults and children. °Patients are selected via a lottery and there is often a waiting list. °  °[facility] [address], °[place] ° (336) 763-8833 [email or web address] °  °[facility] [address], [place], [place] (336)723-1848, Ext. 123 Second and Fourth Thursday of each month, opens at 6:30 AM; Clinic ends at 9 AM.  Patients are seen on a first-come first-served basis, and a limited number are seen during each clinic.  ° °[facility] ° [address], [place], [place] (336) 723-7904   Eligibility Requirements °You must have lived in [place], [place], or [place] counties for at least the last three months. °  You  cannot be eligible for state or federal sponsored healthcare insurance, including Veterans Administration, Medicaid, or Medicare. °  You generally cannot be eligible for healthcare insurance through your employer.  °  How to apply: °Eligibility screenings are held every Tuesday and Wednesday afternoon from 1:00 pm until 4:00 pm. You do not need an appointment for the interview!  °[facility] [address], [place], [place] 336-631-2330   °[facility]  336-342-8273   °[facility]  336-703-3100   °[facility]  336-570-6415   ° °Behavioral Health Resources in the Community: °Intensive Outpatient Programs °Organization         Address  Phone  Notes  °[facility] [address], [place], [place] 336-878-6098   °[facility] [address], [place], [place] 336-832-9800   °[facility] [address], [place], [place] ° 336-882-2125   °[facility] [address],  °[place], [place] 1-800-853-5163 or 336-641-4981   °Substance Abuse Resources °Organization         Address  Phone  Notes  °[facility]  336-882-2125   °[facility]  336-784-9470   °[facility]  336-285-9073   °[facility]  336-845-3988   °Residential & Outpatient Substance Abuse Program  1-800-659-3381   °Psychological Services °Organization         Address  Phone  Notes  °[facility]  336- 832-9600   °[facility]  336- 378-7881   °[facility] [address], [place] 1-800-853-5163 or 336-641-4981   ° °Mobile Crisis Teams °Organization         Address  Phone  Notes  °[facility], Mobile Crisis Care Unit  1-877-626-1772   °Assertive °[facility] ° [address] [place], [place] 336-834-9664   °Sharon DeEsch [address] °[place] 336-554-5454   ° °Self-Help/Support  Groups °Organization         Address  Phone             Notes  °[facility] - variety of support groups  336- 373-1402 Call for more information  °Narcotics Anonymous (NA), [facility] [address], °[place]  2 meetings at this location  ° °Residential Treatment Programs °Organization         Address  Phone  Notes  °[facility] [address],    °[place]  1-866-801-8205   °[facility] ° [address], [place], [place] 704-293-8524   °[facility] [address], [place] 336-845-3988 Admissions: 8am-3pm M-F  °[facility] [address]    °[place], [place] 336-841-1104   °[facility] [address], [place], [place] 336-379-7146   °[facility] [address]  °[place], [place] 336-285-9073   °[facility] - Intensive Outpatient [address], [place], [place] 336-852-3033   °[facility] [address]  °[place], [place] 1-877-615-2722 or 336-784-9470   °[facility] [address] [place], [place] 336-227-7417 Accepts Medicaid  °[facility] [address]  °[place] 1-800-659-3381 Substance Abuse/Addiction Treatment  ° °[place] Behavioral Health Resources °Organization         Address  Phone  Notes  °[facility]  (888) 581-9988   °Julie Brannon, PhD [address] [place], [place]   (336) 349-5553 or (336) 951-0000   °[facility]   [address] °[place], [place] (336) 349-4454   °[facility] [address], [place], [place] (336) 342-8316 Insurance/Medicaid/sponsorship through [facility]  °[facility] [address]                                    [place], [place] (336) 342-8316 Therapy/tele-psych/case  °[facility]   [address]  ° [place], [place] (336) 349-2233    °Dr. Arfeen  (336) 349-4544   °[facility] [place]  [employer] [facility] [place] [facility] 1) [address],  °2) [address], [place] °3)  [address], [place] (336) 349-3220 °(336) 342-7768 ° °(336) 342-8140   °[place] Child [facility] (336) 342-1394 or (336) 342-3537 (After Hours)    ° ° ° °

## 2015-05-17 ENCOUNTER — Emergency Department (HOSPITAL_COMMUNITY)
Admission: EM | Admit: 2015-05-17 | Discharge: 2015-05-17 | Disposition: A | Discharge status: Home / Self Care | Payer: Self-pay | Attending: Emergency Medicine | Admitting: Emergency Medicine

## 2015-05-17 ENCOUNTER — Emergency Department (HOSPITAL_COMMUNITY): Payer: Self-pay

## 2015-05-17 ENCOUNTER — Encounter (HOSPITAL_COMMUNITY): Payer: Self-pay | Admitting: Emergency Medicine

## 2015-05-17 DIAGNOSIS — Y9367 Activity, basketball: Secondary | ICD-10-CM | POA: Insufficient documentation

## 2015-05-17 DIAGNOSIS — Z87891 Personal history of nicotine dependence: Secondary | ICD-10-CM | POA: Insufficient documentation

## 2015-05-17 DIAGNOSIS — S93402A Sprain of unspecified ligament of left ankle, initial encounter: Secondary | ICD-10-CM | POA: Insufficient documentation

## 2015-05-17 DIAGNOSIS — X58XXXA Exposure to other specified factors, initial encounter: Secondary | ICD-10-CM | POA: Insufficient documentation

## 2015-05-17 DIAGNOSIS — Y998 Other external cause status: Secondary | ICD-10-CM | POA: Insufficient documentation

## 2015-05-17 DIAGNOSIS — Z791 Long term (current) use of non-steroidal anti-inflammatories (NSAID): Secondary | ICD-10-CM | POA: Insufficient documentation

## 2015-05-17 DIAGNOSIS — Z79899 Other long term (current) drug therapy: Secondary | ICD-10-CM | POA: Insufficient documentation

## 2015-05-17 DIAGNOSIS — Y92310 Basketball court as the place of occurrence of the external cause: Secondary | ICD-10-CM | POA: Insufficient documentation

## 2015-05-17 MED ORDER — HYDROMORPHONE HCL 1 MG/ML IJ SOLN
1.0000 mg | Freq: Once | INTRAMUSCULAR | Status: AC
  Administered 2015-05-17: 1 mg via INTRAMUSCULAR
  Filled 2015-05-17: qty 1

## 2015-05-17 MED ORDER — HYDROCODONE-ACETAMINOPHEN 5-325 MG PO TABS: 2.0000 | ORAL_TABLET | ORAL | Status: DC | PRN

## 2015-05-17 NOTE — Discharge Instructions (Signed)
Ankle Sprain Take Motrin for pain and hydrocodone for breakthrough pain. Apply ice to the area. Rest. Elevate. Follow-up with orthopedics. Use crutches as needed. An ankle sprain is an injury to the strong, fibrous tissues (ligaments) that hold the bones of your ankle joint together.  CAUSES An ankle sprain is usually caused by a fall or by twisting your ankle. Ankle sprains most commonly occur when you step on the outer edge of your foot, and your ankle turns inward. People who participate in sports are more prone to these types of injuries.  SYMPTOMS   Pain in your ankle. The pain may be present at rest or only when you are trying to stand or walk.  Swelling.  Bruising. Bruising may develop immediately or within 1 to 2 days after your injury.  Difficulty standing or walking, particularly when turning corners or changing directions. DIAGNOSIS  Your caregiver will ask you details about your injury and perform a physical exam of your ankle to determine if you have an ankle sprain. During the physical exam, your caregiver will press on and apply pressure to specific areas of your foot and ankle. Your caregiver will try to move your ankle in certain ways. An X-ray exam may be done to be sure a bone was not broken or a ligament did not separate from one of the bones in your ankle (avulsion fracture).  TREATMENT  Certain types of braces can help stabilize your ankle. Your caregiver can make a recommendation for this. Your caregiver may recommend the use of medicine for pain. If your sprain is severe, your caregiver may refer you to a surgeon who helps to restore function to parts of your skeletal system (orthopedist) or a physical therapist. HOME CARE INSTRUCTIONS   Apply ice to your injury for 1-2 days or as directed by your caregiver. Applying ice helps to reduce inflammation and pain.  Put ice in a plastic bag.  Place a towel between your skin and the bag.  Leave the ice on for 15-20 minutes  at a time, every 2 hours while you are awake.  Only take over-the-counter or prescription medicines for pain, discomfort, or fever as directed by your caregiver.  Elevate your injured ankle above the level of your heart as much as possible for 2-3 days.  If your caregiver recommends crutches, use them as instructed. Gradually put weight on the affected ankle. Continue to use crutches or a cane until you can walk without feeling pain in your ankle.  If you have a plaster splint, wear the splint as directed by your caregiver. Do not rest it on anything harder than a pillow for the first 24 hours. Do not put weight on it. Do not get it wet. You may take it off to take a shower or bath.  You may have been given an elastic bandage to wear around your ankle to provide support. If the elastic bandage is too tight (you have numbness or tingling in your foot or your foot becomes cold and blue), adjust the bandage to make it comfortable.  If you have an air splint, you may blow more air into it or let air out to make it more comfortable. You may take your splint off at night and before taking a shower or bath. Wiggle your toes in the splint several times per day to decrease swelling. SEEK MEDICAL CARE IF:   You have rapidly increasing bruising or swelling.  Your toes feel extremely cold or you lose feeling  in your foot.  Your pain is not relieved with medicine. SEEK IMMEDIATE MEDICAL CARE IF:  Your toes are numb or blue.  You have severe pain that is increasing. MAKE SURE YOU:   Understand these instructions.  Will watch your condition.  Will get help right away if you are not doing well or get worse. Document Released: 10/17/2005 Document Revised: 07/11/2012 Document Reviewed: 10/29/2011 [facility] Patient Information 2015 [place], [place]. This information is not intended to replace advice given to you by your health care provider. Make sure you discuss any questions you have with your health  care provider.

## 2015-05-17 NOTE — ED Notes (Signed)
Patient brought in his own crutches.

## 2015-05-17 NOTE — ED Notes (Signed)
Patient presents with reports that he injured L ankle yesterday while playing basketball. Reports ankle pain 7/10 which increases to 10/10 with movement.

## 2015-05-17 NOTE — ED Provider Notes (Signed)
CSN: [id number]     Arrival date & time 05/17/15  1925 History  This chart was scribed for non-physician practitioner, Catha GosselinHanna Patel-Mills, PA-C, working with Richardean Canalavid H Yao, MD, by Ronney LionSuzanne Le, ED Scribe. This patient was seen in room TR09C/TR09C and the patient's care was started at 7:53 PM.      Chief Complaint  Patient presents with  . Ankle Pain   The history is provided by the patient. No language interpreter was used.    HPI Comments: Paul Marshall is a 24 y.o. male who presents to the Emergency Department complaining of constant, severe left ankle pain and swelling that began yesterday following an inversion injury. He was playing basketball when he inverted his ankle and heard a "pop." He states he has not been able to bear weight on it since the injury secondary to pain; he has been hobbling to ambulate. Patient had taken ibuprofen and applied ice to the ankle yesterday with minimal relief. He denies a history of any prior ankle injuries or surgeries. His mother drove him here today.   No past medical history on file. Past Surgical History  Procedure Laterality Date  . Abdominal surgery     History reviewed. No pertinent family history. History  Substance Use Topics  . Smoking status: Former Games developermoker  . Smokeless tobacco: Not on file  . Alcohol Use: No    Review of Systems  Constitutional: Negative for fever.  Musculoskeletal: Positive for joint swelling and arthralgias (left ankle pain). Negative for myalgias.  Neurological: Negative for weakness and numbness.    Allergies  Bactrim  Home Medications   Prior to Admission medications   Medication Sig Start Date End Date Taking? Authorizing Provider  naproxen (NAPROSYN) 500 MG tablet Take 1 tablet (500 mg total) by mouth 2 (two) times daily. 03/31/15   Felicie Mornavid Smith, NP  penicillin v potassium (VEETID) 500 MG tablet Take 1 tablet (500 mg total) by mouth 3 (three) times daily. 03/31/15   Felicie Mornavid Smith, NP   BP 140/77 mmHg  Pulse  81  Temp(Src) 98.3 F (36.8 C) (Oral)  Resp 18  Ht 5\' 10"  (1.778 m)  Wt 190 lb (86.183 kg)  BMI 27.26 kg/m2  SpO2 100% Physical Exam  Constitutional: He is oriented to person, place, and time. He appears well-developed and well-nourished. No distress.  HENT:  Head: Normocephalic and atraumatic.  Eyes: Conjunctivae and EOM are normal.  Neck: Neck supple. No tracheal deviation present.  Cardiovascular: Normal rate.   Pulmonary/Chest: Effort normal. No respiratory distress.  Musculoskeletal: Normal range of motion. He exhibits tenderness.  Left foot and ankle are significantly swollen than the right. Good DP pulse. Able to flex and extend toes. No fifth metatarsal tenderness.   Neurological: He is alert and oriented to person, place, and time.  Skin: Skin is warm and dry.  Psychiatric: He has a normal mood and affect. His behavior is normal.  Nursing note and vitals reviewed.   ED Course  Procedures (including critical care time)  DIAGNOSTIC STUDIES: Oxygen Saturation is 100% on RA, normal by my interpretation.    COORDINATION OF CARE: 7:53 PM - Discussed treatment plan with pt at bedside which includes left ankle XR, and pt agreed to plan.   Imaging Review Dg Ankle Complete Left  05/17/2015   CLINICAL DATA:  24 year old male with trauma and ankle pain.  EXAM: LEFT ANKLE COMPLETE - 3+ VIEW  COMPARISON:  None.  FINDINGS: No acute fracture or dislocation. The ankle mortise is  intact. There is soft tissue swelling of the ankle predominantly over the lateral malleolus  IMPRESSION: No acute fracture or dislocation.   Electronically Signed   By: Elgie Collard M.D.   On: 05/17/2015 20:20   MDM   Final diagnoses:  Left ankle sprain, initial encounter  Patient presents for left ankle injury. Ice was applied. [place] ankle rules reviewed. X-ray of ankle is negative for acute fracture or dislocation. Patient brought in his own crutches. He was placed in a ankle splint. He can take  ibuprofen for pain and hydrocodone for breakthrough pain. I do not suspect Achilles tendon rupture. I gave him orthopedic follow-up.  I personally performed the services described in this documentation, which was scribed in my presence. The recorded information has been reviewed and is accurate.     Catha Gosselin, PA-C 05/17/15 2228  Richardean Canal, MD 05/17/15 2329

## 2016-02-06 ENCOUNTER — Encounter (HOSPITAL_COMMUNITY): Payer: Self-pay | Admitting: Family Medicine

## 2016-02-06 ENCOUNTER — Emergency Department (HOSPITAL_COMMUNITY)
Admission: EM | Admit: 2016-02-06 | Discharge: 2016-02-06 | Disposition: A | Discharge status: Home / Self Care | Payer: Self-pay | Attending: Emergency Medicine | Admitting: Emergency Medicine

## 2016-02-06 DIAGNOSIS — Y998 Other external cause status: Secondary | ICD-10-CM | POA: Insufficient documentation

## 2016-02-06 DIAGNOSIS — T22231A Burn of second degree of right upper arm, initial encounter: Secondary | ICD-10-CM | POA: Insufficient documentation

## 2016-02-06 DIAGNOSIS — Y9389 Activity, other specified: Secondary | ICD-10-CM | POA: Insufficient documentation

## 2016-02-06 DIAGNOSIS — Y9289 Other specified places as the place of occurrence of the external cause: Secondary | ICD-10-CM | POA: Insufficient documentation

## 2016-02-06 DIAGNOSIS — X19XXXA Contact with other heat and hot substances, initial encounter: Secondary | ICD-10-CM | POA: Insufficient documentation

## 2016-02-06 DIAGNOSIS — Z87891 Personal history of nicotine dependence: Secondary | ICD-10-CM | POA: Insufficient documentation

## 2016-02-06 DIAGNOSIS — Z791 Long term (current) use of non-steroidal anti-inflammatories (NSAID): Secondary | ICD-10-CM | POA: Insufficient documentation

## 2016-02-06 MED ORDER — TETANUS-DIPHTH-ACELL PERTUSSIS 5-2.5-18.5 LF-MCG/0.5 IM SUSP
0.5000 mL | Freq: Once | INTRAMUSCULAR | Status: AC
  Administered 2016-02-06: 0.5 mL via INTRAMUSCULAR
  Filled 2016-02-06: qty 0.5

## 2016-02-06 NOTE — ED Provider Notes (Signed)
CSN: [id number]     Arrival date & time 02/06/16  [id number] History  By signing my name below, I, Paul Marshall, attest that this documentation has been prepared under the direction and in the presence of non-physician practitioner, Paul CaptainAbigail Sukhmani Fetherolf, PA-C. Electronically Signed: Freida Busmaniana Marshall, Scribe. 02/06/2016. 10:26 AM.  Chief Complaint  Patient presents with  . Burn   The history is provided by the patient. No language interpreter was used.     HPI Comments:  Paul Marshall is a 25 y.o. male who presents to the Emergency Department complaining of a burn to the  back of his right elbow which he sustained yesterday. 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 He reports associated 4/10 to the site. No modifying factors noted. Pt has no other complaints or symptoms at this time. Tetanus status is unknown.   History reviewed. No pertinent past medical history. Past Surgical History  Procedure Laterality Date  . Abdominal surgery     No family history on file. Social History  Substance Use Topics  . Smoking status: Former Games developermoker  . Smokeless tobacco: None  . Alcohol Use: No    Review of Systems  Constitutional: Negative for fever.  Skin: Positive for wound (burn).    Allergies  Bactrim  Home Medications   Prior to Admission medications   Medication Sig Start Date End Date Taking? Authorizing Provider  HYDROcodone-acetaminophen (NORCO/VICODIN) 5-325 MG per tablet Take 2 tablets by mouth every 4 (four) hours as needed. 05/17/15   Paul Patel-Mills, PA-C  naproxen (NAPROSYN) 500 MG tablet Take 1 tablet (500 mg total) by mouth 2 (two) times daily. 03/31/15   Paul Mornavid Smith, NP  penicillin v potassium (VEETID) 500 MG tablet Take 1 tablet (500 mg total) by mouth 3 (three) times daily. 03/31/15   Paul Mornavid Smith, NP   BP 135/75 mmHg  Pulse 90  Temp(Src) 97.7 F (36.5 C) (Oral)  Resp 20  SpO2 99% Physical Exam  Constitutional: He is oriented to  person, place, and time. He appears well-developed and well-nourished. No distress.  HENT:  Head: Normocephalic and atraumatic.  Eyes: Conjunctivae are normal.  Cardiovascular: Normal rate.   Pulmonary/Chest: Effort normal.  Abdominal: He exhibits no distension.  Neurological: He is alert and oriented to person, place, and time.  Skin: Skin is warm and dry.  4 cm x 2 cm wide burn to right posterior arm  Draining serous  fluid   Psychiatric: He has a normal mood and affect.  Nursing note and vitals reviewed.   ED Course  Procedures   DIAGNOSTIC STUDIES:  Oxygen Saturation is 99% on RA, normal by my interpretation.    COORDINATION OF CARE:  10:18 AM Discussed treatment plan with pt at bedside and pt agreed to plan.   MDM  Tetanus updated and wound cleaned  in ED.  Discussed wound care with pt and answered questions. Pt to follow up with PCP for wound check  should there be signs of dehiscence or infection. Pt is hemodynamically stable with no complaints prior to dc.    Final diagnoses:  Second degree burn of right upper arm, initial encounter    I personally performed the services described in this documentation, which was scribed in my presence. The recorded information has been reviewed and is accurate.     Paul Captainbigail Starkeisha Vanwinkle, PA-C 02/14/16 1658  Rolan BuccoMelanie Belfi, MD 02/23/16 1558

## 2016-02-06 NOTE — ED Notes (Addendum)
Pt presents from home via POV with c/o right upper posterior arm burn that occurred yesterday while at work.  States accidentally touched area to metal oven.  He had a blackened blister over it until this morning when he rolled over in bed and the blistered skin came off.  Scant amount of serous exudate noted.

## 2016-02-06 NOTE — ED Notes (Signed)
No evidence of reaction to vaccination. Pt ambulatory from treatment room, in NAD. No Rx.

## 2016-02-06 NOTE — Discharge Instructions (Signed)
Clean and change dressings 2x day. Apply bacitracin or neosporin (+ pain reliever if preffered.) You may use the generic version.   Second-Degree Burn A second-degree burn affects the 2 outer layers of skin. The outer layer (epidermis) and the layer underneath it (dermis) are both burned. Another name for this type of burn is a partial thickness burn. A second-degree burn may be called minor or major. This depends on the size of the burn. It also depends on what parts of the skin are burned. Minor burns may be treated with first aid. Major burns are a medical emergency. A second-degree burn is worse than a first-degree burn, but not as bad as a third-degree burn. A first-degree burn affects only the epidermis. A third-degree burn goes through all the layers of skin. A second-degree burn usually heals in 3 to 4 weeks. A minor second-degree burn usually does not leave a scar.Deeper second-degree burns may lead to scarring of the skin or contractures over joints.Contractures are scars that form over joints and may lead to reduced mobility at those joints. CAUSES  Heat (thermal) injury. This happens when skin comes in contact with something very hot. It could be a flame, a hot object, hot liquid, or steam. Most second-degree burns are thermal injuries.  Radiation. Sunlight is one type of radiation that can burn the skin. Another type of radiation is used to heat food. Radiation is also used to treat some diseases, such as cancer. All types of radiation can burn the skin. Sunlight usually causes a first-degree burn. Radiation used for heating food or treating a disease can cause a second-degree burn.  Electricity. Electrical burns can cause more damage under the skin than on the surface. They should always be treated as major burns.  Chemicals. Many chemicals can burn the skin. The burn should be flushed with cool water and checked by an emergency caregiver. SYMPTOMS Symptoms of second-degree burns  include:  Severe pain.  Extreme tenderness.  Deep redness.  Blistered skin.  Skin that has changed color.It might look blotchy, wet, or shiny.  Swelling. TREATMENT Some second-degree burns may need to be treated in a hospital. These include major burns, electrical burns, and chemical burns. Many other second-degree burns can be treated with regular first aid, such as:  Cooling the burn. Use cool, germ-free (sterile) salt water. Place the burned area of skin into a tub of water, or cover the burned area with clean, wet towels.  Taking pain medicine.  Removing the dead skin from broken blisters. A trained caregiver may do this. Do not pop blisters.  Gently washing your skin with mild soap.  Covering the burned area with a cream.Silver sulfadiazine is a cream for burns. An antibiotic cream, such as bacitracin, may also be used to fight infection. Do not use other ointments or creams unless your caregiver says it is okay.  Protecting the burn with a sterile, non-sticky bandage.  Bandaging fingers and toes separately. This keeps them from sticking together.  Taking an antibiotic. This can help prevent infection.  Getting a tetanus shot. HOME CARE INSTRUCTIONS Medication  Take any medicine prescribed by your caregiver. Follow the directions carefully.  Ask your caregiver if you can take over-the-counter medicine to relieve pain and swelling. Do not give aspirin to children.  Make sure your caregiver knows about all other medicines you take.This includes over-the-counter medicines. Burn care  You will need to change the bandage on your burn. You may need to do this 2 or  3 times each day.  Gently clean the burned area.  Put ointment on it.  Cover the burn with a sterile bandage.  For some deeper burns or burns that cover a large area, compression garments may be prescribed. These garments can help minimize scarring and protect your mobility.  Do not put butter or oil  on your skin. Use only the cream prescribed by your caregiver.  Do not put ice on your burn.  Do not break blisters on your skin.  Keep the bandaged area dry. You might need to take a sponge bath for awhile.Ask your caregiver when you can take a shower or a tub bath again.  Do not scratch an itchy burn. Your caregiver may give you medicine to relieve very bad itching.  Infection is a big danger after a second-degree burn. Tell your caregiver right away if you have signs of infection, such as:  Redness or changing color in the burned area.  Fluid leaking from the burn.  Swelling in the burn area.  A bad smell coming from the wound. Follow-up  Keep all follow-up appointments.This is important. This is how your caregiver can tell if your treatment is working.  Protect your burn from sunlight.Use sunscreen whenever you go outside.Burned areas may be sensitive to the sun for up to 1 year. Exposure to the sun may also cause permanent darkening of scars. SEEK MEDICAL CARE IF:  You have any questions about medicines.  You have any questions about your treatment.  You wonder if it is okay to do a particular activity.  You develop a fever of more than 100.5 F (38.1 C). SEEK IMMEDIATE MEDICAL CARE IF:  You think your burn might be infected. It may change color, become red, leak fluid, swell, or smell bad.  You develop a fever of more than 102 F (38.9 C).   This information is not intended to replace advice given to you by your health care provider. Make sure you discuss any questions you have with your health care provider.   Document Released: 03/21/2011 Document Revised: 01/09/2012 Document Reviewed: 03/21/2011 Elsevier Interactive Patient Education 2016 [employer].  Burn Care Your skin is a natural barrier to infection. It is the largest organ of your body. Burns damage this natural protection. To help prevent infection, it is very important to follow your caregiver's  instructions in the care of your burn. Burns are classified as:  First degree. There is only redness of the skin (erythema). No scarring is expected.  Second degree. There is blistering of the skin. Scarring may occur with deeper burns.  Third degree. All layers of the skin are injured, and scarring is expected. HOME CARE INSTRUCTIONS   Wash your hands well before changing your bandage.  Change your bandage as often as directed by your caregiver.  Remove the old bandage. If the bandage sticks, you may soak it off with cool, clean water.  Cleanse the burn thoroughly but gently with mild soap and water.  Pat the area dry with a clean, dry cloth.  Apply a thin layer of antibacterial cream to the burn.  Apply a clean bandage as instructed by your caregiver.  Keep the bandage as clean and dry as possible.  Elevate the affected area for the first 24 hours, then as instructed by your caregiver.  Only take over-the-counter or prescription medicines for pain, discomfort, or fever as directed by your caregiver. SEEK IMMEDIATE MEDICAL CARE IF:   You develop excessive pain.  You  develop redness, tenderness, swelling, or red streaks near the burn.  The burned area develops yellowish-white fluid (pus) or a bad smell.  You have a fever. MAKE SURE YOU:   Understand these instructions.  Will watch your condition.  Will get help right away if you are not doing well or get worse.   This information is not intended to replace advice given to you by your health care provider. Make sure you discuss any questions you have with your health care provider.   Document Released: 10/17/2005 Document Revised: 01/09/2012 Document Reviewed: 03/09/2011 Elsevier Interactive Patient Education [employer] Elsevier Inc.

## 2016-09-12 ENCOUNTER — Emergency Department (HOSPITAL_COMMUNITY)
Admission: EM | Admit: 2016-09-12 | Discharge: 2016-09-12 | Disposition: A | Discharge status: Home / Self Care | Payer: BLUE CROSS/BLUE SHIELD | Attending: Emergency Medicine | Admitting: Emergency Medicine

## 2016-09-12 ENCOUNTER — Encounter (HOSPITAL_COMMUNITY): Payer: Self-pay | Admitting: Nurse Practitioner

## 2016-09-12 DIAGNOSIS — F17200 Nicotine dependence, unspecified, uncomplicated: Secondary | ICD-10-CM | POA: Insufficient documentation

## 2016-09-12 DIAGNOSIS — J069 Acute upper respiratory infection, unspecified: Secondary | ICD-10-CM | POA: Insufficient documentation

## 2016-09-12 MED ORDER — SALINE SPRAY 0.65 % NA SOLN
1.0000 | Freq: Once | NASAL | Status: AC
  Administered 2016-09-12: 1 via NASAL
  Filled 2016-09-12: qty 44

## 2016-09-12 MED ORDER — IBUPROFEN 400 MG PO TABS
600.0000 mg | ORAL_TABLET | Freq: Once | ORAL | Status: AC
  Administered 2016-09-12: 600 mg via ORAL
  Filled 2016-09-12: qty 1

## 2016-09-12 NOTE — ED Provider Notes (Signed)
[facility] Provider Note   CSN: [id number] Arrival date & time: 09/12/16  1300     History   Chief Complaint Chief Complaint  Patient presents with  . Cough  . GI Problem    HPI Arlyss QueenCedric Magallon is a 25 y.o. male.  Pt presents to the ED today because he needs a note for work.  He has had uri sx for the past few days.  He is feeling much better today, but could not go back to work until he saw a doctor.        History reviewed. No pertinent past medical history.  There are no active problems to display for this patient.   Past Surgical History:  Procedure Laterality Date  . ABDOMINAL SURGERY         Home Medications    Prior to Admission medications   Medication Sig Start Date End Date Taking? Authorizing Provider  HYDROcodone-acetaminophen (NORCO/VICODIN) 5-325 MG per tablet Take 2 tablets by mouth every 4 (four) hours as needed. 05/17/15   Hanna Patel-Mills, PA-C  naproxen (NAPROSYN) 500 MG tablet Take 1 tablet (500 mg total) by mouth 2 (two) times daily. 03/31/15   Felicie Mornavid Smith, NP  penicillin v potassium (VEETID) 500 MG tablet Take 1 tablet (500 mg total) by mouth 3 (three) times daily. 03/31/15   Felicie Mornavid Smith, NP    Family History History reviewed. No pertinent family history.  Social History Social History  Substance Use Topics  . Smoking status: Current Some Day Smoker  . Smokeless tobacco: Never Used  . Alcohol use No     Allergies   Bactrim   Review of Systems Review of Systems  Constitutional: Positive for fever.  HENT: Positive for congestion.   Respiratory: Positive for cough.   Gastrointestinal: Positive for nausea.  All other systems reviewed and are negative.    Physical Exam Updated Vital Signs BP 113/77   Pulse 86   Temp 98.4 F (36.9 C) (Oral)   Resp 18   SpO2 100%   Physical Exam  Constitutional: He is oriented to person, place, and time. He appears well-developed and well-nourished.  HENT:  Head: Normocephalic  and atraumatic.  Right Ear: External ear normal.  Left Ear: External ear normal.  Nose: Nose normal.  Mouth/Throat: Oropharynx is clear and moist.  Eyes: Conjunctivae and EOM are normal. Pupils are equal, round, and reactive to light.  Neck: Normal range of motion. Neck supple.  Cardiovascular: Normal rate, regular rhythm, normal heart sounds and intact distal pulses.   Pulmonary/Chest: Effort normal and breath sounds normal.  Abdominal: Soft. Bowel sounds are normal.  Musculoskeletal: Normal range of motion.  Neurological: He is alert and oriented to person, place, and time.  Skin: Skin is warm.  Psychiatric: He has a normal mood and affect. His behavior is normal. Judgment and thought content normal.  Nursing note and vitals reviewed.    ED Treatments / Results  Labs (all labs ordered are listed, but only abnormal results are displayed) Labs Reviewed - No data to display  EKG  EKG Interpretation None       Radiology No results found.  Procedures Procedures (including critical care time)  Medications Ordered in ED Medications  sodium chloride (OCEAN) 0.65 % nasal spray 1 spray (not administered)  ibuprofen (ADVIL,MOTRIN) tablet 600 mg (not administered)     Initial Impression / Assessment and Plan / ED Course  I have reviewed the triage vital signs and the nursing notes.  Pertinent labs &  imaging results that were available during my care of the patient were reviewed by me and considered in my medical decision making (see chart for details).  Clinical Course     Pt given a note for work.  He is also given saline nasal spray.  He is instructed to take otc antihistamines or decongestants.  He knows to return if worse.  Final Clinical Impressions(s) / ED Diagnoses   Final diagnoses:  Viral upper respiratory tract infection    New Prescriptions New Prescriptions   No medications on file     Jacalyn LefevreJulie Kael Keetch, MD 09/12/16 1355

## 2016-09-12 NOTE — ED Triage Notes (Signed)
Pt presents with c/o flu like illness. His symptoms began Friday. He reports fevers, fatigue, headaches, cough, congestion, sinus pressure, body aches, diarrhea. He denies any weakness, n/v, abd pain, urinary changes.he has been taking mucinex for cough. He is feeling better and his symptoms have improved but he works in [employer] and his boss wanted him to see the doctor.

## 2016-09-12 NOTE — ED Notes (Signed)
Pt is in stable condition upon d/c and ambulates from ED. 

## 2016-09-12 NOTE — Discharge Instructions (Signed)
Take otc antihistamines like benadryl or claritin and decongestants like pseudoephedrine.

## 2018-11-24 ENCOUNTER — Ambulatory Visit (HOSPITAL_COMMUNITY)
Admission: EM | Admit: 2018-11-24 | Discharge: 2018-11-24 | Disposition: A | Discharge status: Home / Self Care | Payer: BLUE CROSS/BLUE SHIELD | Attending: Emergency Medicine | Admitting: Emergency Medicine

## 2018-11-24 ENCOUNTER — Other Ambulatory Visit: Payer: Self-pay

## 2018-11-24 ENCOUNTER — Encounter (HOSPITAL_COMMUNITY): Payer: Self-pay | Admitting: Emergency Medicine

## 2018-11-24 DIAGNOSIS — M7918 Myalgia, other site: Secondary | ICD-10-CM

## 2018-11-24 DIAGNOSIS — J111 Influenza due to unidentified influenza virus with other respiratory manifestations: Secondary | ICD-10-CM

## 2018-11-24 DIAGNOSIS — K5289 Other specified noninfective gastroenteritis and colitis: Secondary | ICD-10-CM

## 2018-11-24 DIAGNOSIS — R69 Illness, unspecified: Secondary | ICD-10-CM | POA: Insufficient documentation

## 2018-11-24 DIAGNOSIS — R51 Headache: Secondary | ICD-10-CM

## 2018-11-24 DIAGNOSIS — R05 Cough: Secondary | ICD-10-CM

## 2018-11-24 MED ORDER — ONDANSETRON HCL 8 MG PO TABS: 8.0000 mg | ORAL_TABLET | Freq: Three times a day (TID) | ORAL | 0 refills | Status: DC | PRN

## 2018-11-24 MED ORDER — OSELTAMIVIR PHOSPHATE 75 MG PO CAPS: 75.0000 mg | ORAL_CAPSULE | Freq: Two times a day (BID) | ORAL | 0 refills | Status: DC

## 2018-11-24 MED ORDER — ACETAMINOPHEN 325 MG PO TABS
650.0000 mg | ORAL_TABLET | Freq: Once | ORAL | Status: AC
  Administered 2018-11-24: 650 mg via ORAL

## 2018-11-24 MED ORDER — ACETAMINOPHEN 325 MG PO TABS
ORAL_TABLET | ORAL | Status: AC
  Filled 2018-11-24: qty 2

## 2018-11-24 NOTE — Discharge Instructions (Signed)
Rest and push fluids to prevent dehydration You may continue Tylenol for fever and aches up to 1000 mg every 6 hours.   If you get worse in 48 h, you need to be seen again.

## 2018-11-24 NOTE — ED Triage Notes (Signed)
The patient presented to the [facility] with a complaint of N/V and back pain. The patient reported a fever earlier.

## 2018-11-24 NOTE — ED Provider Notes (Signed)
[facility]    CSN: [id number] Arrival date & time: 11/24/18  1741     History   Chief Complaint Chief Complaint  Patient presents with  . Nausea  . Back Pain    HPI Paul Marshall is a 28 y.o. male.   Fever onset of 102 today.He woke up yesterday with itchy throat and cough  And took cold med, and had V/D after taking cough med. His wife and him ate the same fast food and she is fine. Today he feels worse with body aches, HA, cough and rhinitis.  Denies  UTi stymptoms      History reviewed. No pertinent past medical history.  There are no active problems to display for this patient.   Past Surgical History:  Procedure Laterality Date  . ABDOMINAL SURGERY         Home Medications    Prior to Admission medications   Medication Sig Start Date End Date Taking? Authorizing Provider  ondansetron (ZOFRAN) 8 MG tablet Take 1 tablet (8 mg total) by mouth every 8 (eight) hours as needed for nausea or vomiting. 11/24/18   Rodriguez-Southworth, Nettie Elm, PA-C  oseltamivir (TAMIFLU) 75 MG capsule Take 1 capsule (75 mg total) by mouth every 12 (twelve) hours. 11/24/18   Rodriguez-Southworth, Nettie Elm, PA-C    Family History History reviewed. No pertinent family history.  Social History Social History   Tobacco Use  . Smoking status: Current Some Day Smoker  . Smokeless tobacco: Never Used  Substance Use Topics  . Alcohol use: No  . Drug use: Yes    Frequency: 3.0 times per week    Types: Marijuana     Allergies   Bactrim   Review of Systems Review of Systems  Constitutional: Positive for appetite change, chills, diaphoresis, fatigue and fever.  HENT: Positive for postnasal drip and rhinorrhea. Negative for ear discharge, ear pain, sore throat, trouble swallowing and voice change.   Eyes: Negative for discharge and visual disturbance.  Respiratory: Positive for cough. Negative for shortness of breath and wheezing.   Cardiovascular: Negative for chest  pain.  Gastrointestinal: Positive for nausea and vomiting. Negative for abdominal pain.  Genitourinary: Negative for difficulty urinating, dysuria, flank pain, hematuria and urgency.  Musculoskeletal: Positive for back pain and myalgias. Negative for neck pain and neck stiffness.  Skin: Negative for rash.  Neurological: Positive for headaches. Negative for dizziness.  Hematological: Negative for adenopathy.   Physical Exam Triage Vital Signs ED Triage Vitals  Enc Vitals Group     BP 11/24/18 1925 114/68     Pulse Rate 11/24/18 1925 (!) 105     Resp 11/24/18 1925 16     Temp 11/24/18 1925 (!) 101.9 F (38.8 C)     Temp Source 11/24/18 1925 Oral     SpO2 11/24/18 1925 98 %     Weight --      Height --      Head Circumference --      Peak Flow --      Pain Score 11/24/18 1923 8     Pain Loc --      Pain Edu? --      Excl. in GC? --    No data found.  Updated Vital Signs BP 114/68 (BP Location: Left Arm)   Pulse (!) 105   Temp (!) 101.9 F (38.8 C) (Oral)   Resp 16   SpO2 98%   Visual Acuity Right Eye Distance:   Left Eye Distance:  Bilateral Distance:    Right Eye Near:   Left Eye Near:    Bilateral Near:     Physical Exam Vitals signs and nursing note reviewed.  Constitutional:      Appearance: He is ill-appearing. He is not toxic-appearing or diaphoretic.  HENT:     Head: Normocephalic.     Right Ear: Tympanic membrane, ear canal and external ear normal.     Left Ear: Tympanic membrane, ear canal and external ear normal.     Nose: Congestion and rhinorrhea present.     Comments: Clear mucous    Mouth/Throat:     Mouth: Mucous membranes are moist.  Eyes:     General: No scleral icterus.       Right eye: No discharge.        Left eye: No discharge.     Conjunctiva/sclera: Conjunctivae normal.  Neck:     Musculoskeletal: Neck supple. No neck rigidity or muscular tenderness.  Cardiovascular:     Rate and Rhythm: Normal rate and regular rhythm.     Heart  sounds: No murmur.  Pulmonary:     Effort: Pulmonary effort is normal.     Breath sounds: Normal breath sounds. No wheezing, rhonchi or rales.  Abdominal:     General: Abdomen is flat. There is no distension.     Palpations: Abdomen is soft. There is no mass.     Tenderness: There is no abdominal tenderness. There is no right CVA tenderness, left CVA tenderness, guarding or rebound.     Hernia: No hernia is present.  Musculoskeletal: Normal range of motion.  Lymphadenopathy:     Cervical: No cervical adenopathy.  Skin:    General: Skin is warm and dry.     Findings: No rash.  Neurological:     Mental Status: He is alert and oriented to person, place, and time.     Gait: Gait normal.  Psychiatric:        Mood and Affect: Mood normal.        Behavior: Behavior normal.        Thought Content: Thought content normal.        Judgment: Judgment normal.    UC Treatments / Results  Labs (all labs ordered are listed, but only abnormal results are displayed) Labs Reviewed - No data to display  EKG None  Radiology No results found.  Procedures Procedures   Medications Ordered in UC Medications  acetaminophen (TYLENOL) tablet 650 mg (650 mg Oral Given 11/24/18 1929)    Initial Impression / Assessment and Plan / UC Course  I have reviewed the triage vital signs and the nursing notes. I explained to him and his wife that we dont have the flu test here, but I suspect he has this. He was placed on Tamiflu and Zofran as noted. See instructions.  If he gets worse, needs to be seen again.   Final Clinical Impressions(s) / UC Diagnoses   Final diagnoses:  Influenza-like illness  Other noninfectious gastroenteritis     Discharge Instructions     Rest and push fluids to prevent dehydration You may continue Tylenol for fever and aches up to 1000 mg every 6 hours.   If you get worse in 48 h, you need to be seen again.       ED Prescriptions    Medication Sig Dispense Auth.  Provider   oseltamivir (TAMIFLU) 75 MG capsule Take 1 capsule (75 mg total) by mouth every 12 (twelve) hours.  10 capsule Rodriguez-Southworth, Nettie Elm, PA-C   ondansetron (ZOFRAN) 8 MG tablet Take 1 tablet (8 mg total) by mouth every 8 (eight) hours as needed for nausea or vomiting. 20 tablet Rodriguez-Southworth, Nettie Elm, PA-C     Controlled Substance Prescriptions [place] Controlled Substance Registry consulted?    Garey Ham, [place] 11/25/18 1944

## 2018-11-26 ENCOUNTER — Telehealth (HOSPITAL_COMMUNITY): Payer: Self-pay | Admitting: Emergency Medicine

## 2018-11-26 NOTE — Telephone Encounter (Signed)
Patient was seen in department on 1/25.  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  Dr Milus Glazier instructed visitor to receive a work note to go back to work on Wednesday.  Provided work note as instructed

## 2019-05-03 ENCOUNTER — Other Ambulatory Visit: Payer: Self-pay

## 2019-05-03 ENCOUNTER — Encounter (HOSPITAL_COMMUNITY): Payer: Self-pay | Admitting: Emergency Medicine

## 2019-05-03 ENCOUNTER — Ambulatory Visit (HOSPITAL_COMMUNITY)
Admission: EM | Admit: 2019-05-03 | Discharge: 2019-05-03 | Disposition: A | Discharge status: Home / Self Care | Payer: Self-pay | Attending: Family Medicine | Admitting: Family Medicine

## 2019-05-03 DIAGNOSIS — R197 Diarrhea, unspecified: Secondary | ICD-10-CM

## 2019-05-03 NOTE — ED Provider Notes (Signed)
[facility]   [id number] 05/03/19 Arrival Time: 1328  ASSESSMENT & PLAN:  1. Diarrhea, unspecified type    No signs of dehydration requiring IVF at this time. Benign abdominal exam. No indication for urgent abdominal imaging. Question viral vs PO intake related. Observe. No emesis. Work note provided.  Discussed typical duration of symptoms. Will do his best to ensure adequate fluid intake in order to avoid dehydration. Will proceed to the Emergency Department for evaluation if unable to tolerate PO fluids regularly.  Otherwise he will f/u with his PCP or here if not showing improvement over the next 48-72 hours.  Reviewed expectations re: course of current medical issues. Questions answered. Outlined signs and symptoms indicating need for more acute intervention. Patient verbalized understanding. After Visit Summary given.   SUBJECTIVE: History from: patient.  Arlyss QueenCedric Kewley is a 28 y.o. male who presents with complaint of non-bloody diarrhea without n/v. Onset today. Abrupt. Called out of work. Abdominal discomfort: mild and cramping. Symptoms are unchanged since beginning. Aggravating factors: eating. Alleviating factors: none. Associated symptoms: fatigue. He denies arthralgias, belching, chills, fever, headache, myalgias and sweats. Appetite: decreased. PO intake: decreased. Ambulatory without assistance. Urinary symptoms: none. Sick contacts: none. Recent travel or camping: none. OTC treatment: none.   Past Surgical History:  Procedure Laterality Date   ABDOMINAL SURGERY     ROS: As per HPI. All other systems negative.   OBJECTIVE:  Vitals:   05/03/19 1430  BP: 120/69  Pulse: 75  Resp: 14  Temp: 98.2 F (36.8 C)  SpO2: 97%    General appearance: alert; no distress Oropharynx: moist Lungs: clear to auscultation bilaterally; unlabored Heart: regular rate and rhythm Abdomen: soft; non-distended; no significant abdominal tenderness; reports  "cramping" feeling; bowel sounds present; no masses or organomegaly; no guarding or rebound tenderness Back: no CVA tenderness Extremities: no edema; symmetrical with no gross deformities Skin: warm; dry Neurologic: normal gait Psychological: alert and cooperative; normal mood and affect   Allergies  Allergen Reactions   Bactrim Other (See Comments)    Childhood allergy                                                Social History   Socioeconomic History   Marital status: Single    Spouse name: Not on file   Number of children: Not on file   Years of education: Not on file   Highest education level: Not on file  Occupational History   Not on file  Social Needs   Financial resource strain: Not on file   Food insecurity    Worry: Not on file    Inability: Not on file   Transportation needs    Medical: Not on file    Non-medical: Not on file  Tobacco Use   Smoking status: Current Some Day Smoker   Smokeless tobacco: Never Used  Substance and Sexual Activity   Alcohol use: No   Drug use: Yes    Frequency: 3.0 times per week    Types: Marijuana   Sexual activity: Not on file  Lifestyle   Physical activity    Days per week: Not on file    Minutes per session: Not on file   Stress: Not on file  Relationships   Social connections    Talks on phone: Not on file    Gets  together: Not on file    Attends religious service: Not on file    Active member of club or organization: Not on file    Attends meetings of clubs or organizations: Not on file    Relationship status: Not on file   Intimate partner violence    Fear of current or ex partner: Not on file    Emotionally abused: Not on file    Physically abused: Not on file    Forced sexual activity: Not on file  Other Topics Concern   Not on file  Social History Narrative   Not on file   FH: Question of HTN.   Vanessa Kick, MD 05/03/19 [phone]

## 2019-05-03 NOTE — ED Triage Notes (Signed)
Pt states he had to call out of work due to diarrhea starting this morning, his job required for him to come in and get a note.

## 2019-05-03 NOTE — Discharge Instructions (Addendum)

## 2019-06-15 ENCOUNTER — Other Ambulatory Visit: Payer: Self-pay

## 2019-06-15 ENCOUNTER — Ambulatory Visit (HOSPITAL_COMMUNITY)
Admission: EM | Admit: 2019-06-15 | Discharge: 2019-06-15 | Disposition: A | Discharge status: Home / Self Care | Payer: Medicaid Other | Attending: Family Medicine | Admitting: Family Medicine

## 2019-06-15 ENCOUNTER — Encounter (HOSPITAL_COMMUNITY): Payer: Self-pay | Admitting: Emergency Medicine

## 2019-06-15 DIAGNOSIS — Z87891 Personal history of nicotine dependence: Secondary | ICD-10-CM | POA: Insufficient documentation

## 2019-06-15 DIAGNOSIS — R197 Diarrhea, unspecified: Secondary | ICD-10-CM | POA: Insufficient documentation

## 2019-06-15 DIAGNOSIS — R109 Unspecified abdominal pain: Secondary | ICD-10-CM | POA: Insufficient documentation

## 2019-06-15 DIAGNOSIS — Z20828 Contact with and (suspected) exposure to other viral communicable diseases: Secondary | ICD-10-CM | POA: Diagnosis not present

## 2019-06-15 DIAGNOSIS — J45909 Unspecified asthma, uncomplicated: Secondary | ICD-10-CM | POA: Diagnosis not present

## 2019-06-15 DIAGNOSIS — R110 Nausea: Secondary | ICD-10-CM | POA: Insufficient documentation

## 2019-06-15 DIAGNOSIS — Z881 Allergy status to other antibiotic agents status: Secondary | ICD-10-CM | POA: Diagnosis not present

## 2019-06-15 DIAGNOSIS — R195 Other fecal abnormalities: Secondary | ICD-10-CM

## 2019-06-15 DIAGNOSIS — R5383 Other fatigue: Secondary | ICD-10-CM | POA: Insufficient documentation

## 2019-06-15 DIAGNOSIS — Z1159 Encounter for screening for other viral diseases: Secondary | ICD-10-CM

## 2019-06-15 HISTORY — DX: Unspecified asthma, uncomplicated: J45.909

## 2019-06-15 MED ORDER — ONDANSETRON 4 MG PO TBDP: 4.0000 mg | ORAL_TABLET | Freq: Three times a day (TID) | ORAL | 0 refills | Status: DC | PRN

## 2019-06-15 NOTE — Discharge Instructions (Addendum)

## 2019-06-15 NOTE — ED Notes (Signed)
Placed labeled sample in lab

## 2019-06-15 NOTE — ED Provider Notes (Signed)
[place]   [id number] 06/15/19 Arrival Time: 1884  ASSESSMENT & PLAN:  1. Loose stools   2. Nausea without vomiting     Meds ordered this encounter  Medications  . ondansetron (ZOFRAN-ODT) 4 MG disintegrating tablet    Sig: Take 1 tablet (4 mg total) by mouth every 8 (eight) hours as needed for nausea or vomiting.    Dispense:  15 tablet    Refill:  0   COVID-19 testing sent. Will self-quarantine until results are available.  Discussed typical duration of symptoms should this be a viral GI illness. Will do his best to ensure adequate fluid intake in order to avoid dehydration. Will proceed to the Emergency Department for evaluation if unable to tolerate PO fluids regularly.  Otherwise he will f/u with his PCP or here if not showing improvement over the next 48-72 hours.  Reviewed expectations re: course of current medical issues. Questions answered. Outlined signs and symptoms indicating need for more acute intervention. Patient verbalized understanding. After Visit Summary given.   SUBJECTIVE: History from: patient.  Paul Marshall is a 28 y.o. male who presents with complaint of non-bilious, non-bloody intermittent nausea in the absence of vomiting with non-bloody diarrhea. Onset today. Abdominal discomfort: mild and cramping. Symptoms are unchanged since beginning. Does reports mild and generalized headache yesterday evening; better today. Aggravating factors: eating. Alleviating factors: none identified. Associated symptoms: fatigue. He denies belching, chills, fever, myalgias and sweats. Appetite: decreased. PO intake: decreased. Ambulatory without assistance. Urinary symptoms: none. Sick contacts: none. Recent travel or camping: none. OTC treatment: none.  Past Surgical History:  Procedure Laterality Date  . ABDOMINAL SURGERY     ROS: As per HPI. All other systems negative.   OBJECTIVE:  Vitals:   06/15/19 1115  BP: 126/77  Pulse: 78  Resp: 18   Temp: 97.6 F (36.4 C)  TempSrc: Oral  SpO2: 99%    General appearance: alert; no distress Oropharynx: moist Lungs: clear to auscultation bilaterally; unlabored Heart: regular rate and rhythm Abdomen: soft; non-distended; no significant abdominal tenderness; reports "cramping" feeling; bowel sounds present; no masses or organomegaly; no guarding or rebound tenderness Back: no CVA tenderness Extremities: no edema; symmetrical with no gross deformities Skin: warm; dry Neurologic: normal gait Psychological: alert and cooperative; normal mood and affect  Labs:  Labs Reviewed  NOVEL CORONAVIRUS, NAA (HOSPITAL ORDER, SEND-OUT TO REF LAB)     Allergies  Allergen Reactions  . Bactrim Other (See Comments)    Childhood allergy                                               Past Medical History:  Diagnosis Date  . Asthma    Social History   Socioeconomic History  . Marital status: Single    Spouse name: Not on file  . Number of children: Not on file  . Years of education: Not on file  . Highest education level: Not on file  Occupational History  . Not on file  Social Needs  . Financial resource strain: Not on file  . Food insecurity    Worry: Not on file    Inability: Not on file  . Transportation needs    Medical: Not on file    Non-medical: Not on file  Tobacco Use  . Smoking status: Former Research scientist (life sciences)  . Smokeless tobacco: Never Used  Substance and Sexual Activity  . Alcohol use: Yes  . Drug use: Yes    Frequency: 3.0 times per week    Types: Marijuana  . Sexual activity: Not on file  Lifestyle  . Physical activity    Days per week: Not on file    Minutes per session: Not on file  . Stress: Not on file  Relationships  . Social Musicianconnections    Talks on phone: Not on file    Gets together: Not on file    Attends religious service: Not on file    Active member of club or organization: Not on file    Attends meetings of clubs or organizations: Not on file     Relationship status: Not on file  . Intimate partner violence    Fear of current or ex partner: Not on file    Emotionally abused: Not on file    Physically abused: Not on file    Forced sexual activity: Not on file  Other Topics Concern  . Not on file  Social History Narrative  . Not on file      Mardella LaymanHagler, Fara Worthy, MD 06/17/19 [phone]

## 2019-06-15 NOTE — ED Triage Notes (Signed)
Complains of headache last night Woke with abdominal pain, diarrhea and nausea. 2 episodes of diarrhea No vomiting

## 2019-06-16 LAB — NOVEL CORONAVIRUS, NAA (HOSP ORDER, SEND-OUT TO REF LAB; TAT 18-24 HRS): SARS-CoV-2, NAA: NOT DETECTED

## 2019-10-02 ENCOUNTER — Other Ambulatory Visit: Payer: Self-pay

## 2019-10-02 ENCOUNTER — Ambulatory Visit (HOSPITAL_COMMUNITY)
Admission: EM | Admit: 2019-10-02 | Discharge: 2019-10-02 | Disposition: A | Discharge status: Home / Self Care | Payer: HRSA Program | Attending: Family Medicine | Admitting: Family Medicine

## 2019-10-02 ENCOUNTER — Encounter (HOSPITAL_COMMUNITY): Payer: Self-pay | Admitting: Emergency Medicine

## 2019-10-02 DIAGNOSIS — R109 Unspecified abdominal pain: Secondary | ICD-10-CM | POA: Insufficient documentation

## 2019-10-02 DIAGNOSIS — Z87891 Personal history of nicotine dependence: Secondary | ICD-10-CM | POA: Diagnosis not present

## 2019-10-02 DIAGNOSIS — Z20828 Contact with and (suspected) exposure to other viral communicable diseases: Secondary | ICD-10-CM | POA: Diagnosis not present

## 2019-10-02 DIAGNOSIS — K589 Irritable bowel syndrome without diarrhea: Secondary | ICD-10-CM | POA: Insufficient documentation

## 2019-10-02 DIAGNOSIS — J45909 Unspecified asthma, uncomplicated: Secondary | ICD-10-CM | POA: Insufficient documentation

## 2019-10-02 NOTE — ED Triage Notes (Signed)
Patient complains of abdominal pain for a couple of weeks.  Pain is not in a specific area and sometimes involves ribcage.  Patient reports vomiting this morning.  No diarrhea.  Patient does have a slight headache today

## 2019-10-02 NOTE — Discharge Instructions (Addendum)
I printed some information about foods that should help and diet to follow. If your symptoms continue you should  to follow-up with a GI specialist. We will call you if your Covid swab is positive. Work note given

## 2019-10-02 NOTE — ED Provider Notes (Signed)
[facility]    CSN: [id number] Arrival date & time: 10/02/19  2119      History   Chief Complaint Chief Complaint  Patient presents with  . Abdominal Pain    HPI Paul Marshall is a 28 y.o. male.   Patient is a 28 year old male that presents today with approximately 1 month or more of intermittent, waxing waning abdominal cramping, gas, bloating, diarrhea and constipation.  He has a slight headache today.  No abdominal pain currently.  This is been an ongoing problem for him for most of this year.  Reporting he eats a high-protein diet and most of his foods are fried.  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  Atypical does not take anything for his symptoms of this happens.  Nothing specifically makes it better or worse.  No nausea, vomiting, fever, blood in stool.  Last bowel movement was yesterday.  ROS per HPI      Past Medical History:  Diagnosis Date  . Asthma     There are no active problems to display for this patient.   Past Surgical History:  Procedure Laterality Date  . ABDOMINAL SURGERY         Home Medications    Prior to Admission medications   Medication Sig Start Date End Date Taking? Authorizing Provider  diphenhydrAMINE (BENADRYL) 25 MG tablet Take 25 mg by mouth every 6 (six) hours as needed.   Yes [provider]    Family History History reviewed. No pertinent family history.  Social History Social History   Tobacco Use  . Smoking status: Former Games developer  . Smokeless tobacco: Never Used  Substance Use Topics  . Alcohol use: Yes  . Drug use: Yes    Frequency: 3.0 times per week    Types: Marijuana     Allergies   Bactrim   Review of Systems Review of Systems   Physical Exam Triage Vital Signs ED Triage Vitals  Enc Vitals Group     BP 10/02/19 0912 115/79     Pulse Rate 10/02/19 0912 66     Resp 10/02/19 0912 16     Temp 10/02/19 0912 98.2 F (36.8 C)     Temp Source 10/02/19 0912 Oral     SpO2  10/02/19 0912 99 %     Weight --      Height --      Head Circumference --      Peak Flow --      Pain Score 10/02/19 0909 3     Pain Loc --      Pain Edu? --      Excl. in GC? --    No data found.  Updated Vital Signs BP 115/79 (BP Location: Right Arm)   Pulse 66   Temp 98.2 F (36.8 C) (Oral)   Resp 16   SpO2 99%   Visual Acuity Right Eye Distance:   Left Eye Distance:   Bilateral Distance:    Right Eye Near:   Left Eye Near:    Bilateral Near:     Physical Exam Vitals signs and nursing note reviewed.  Constitutional:      Appearance: Normal appearance.  HENT:     Head: Normocephalic and atraumatic.     Nose: Nose normal.  Eyes:     Conjunctiva/sclera: Conjunctivae normal.  Neck:     Musculoskeletal: Normal range of motion.  Pulmonary:     Effort: Pulmonary effort is normal.  Abdominal:  Palpations: Abdomen is soft.     Tenderness: There is no abdominal tenderness.  Musculoskeletal: Normal range of motion.  Skin:    General: Skin is warm and dry.  Neurological:     Mental Status: He is alert.  Psychiatric:        Mood and Affect: Mood normal.      UC Treatments / Results  Labs (all labs ordered are listed, but only abnormal results are displayed) Labs Reviewed  NOVEL CORONAVIRUS, NAA (HOSP ORDER, SEND-OUT TO REF LAB; TAT 18-24 HRS)    EKG   Radiology No results found.  Procedures Procedures (including critical care time)  Medications Ordered in UC Medications - No data to display  Initial Impression / Assessment and Plan / UC Course  I have reviewed the triage vital signs and the nursing notes.  Pertinent labs & imaging results that were available during my care of the patient were reviewed by me and considered in my medical decision making (see chart for details).     Abdominal pain-no acute abdomen on exam today.  Patient currently not having any abdominal pain.  Reporting headache and one episode of diarrhea this morning.  His  work sent him here to be tested. He tested negative for Covid in August We will send him to a GI specialist for his chronic abdominal pain. Information printed out on high-fiber diet and IBS diet Patient understanding and agree. Final Clinical Impressions(s) / UC Diagnoses   Final diagnoses:  Abdominal pain, unspecified abdominal location     Discharge Instructions     I printed some information about foods that should help and diet to follow. If your symptoms continue you should  to follow-up with a GI specialist. We will call you if your Covid swab is positive. Work note given    ED Prescriptions    None     PDMP not reviewed this encounter.   Loura Halt A, NP 10/02/19 1026

## 2019-10-04 LAB — NOVEL CORONAVIRUS, NAA (HOSP ORDER, SEND-OUT TO REF LAB; TAT 18-24 HRS): SARS-CoV-2, NAA: NOT DETECTED

## 2020-02-23 ENCOUNTER — Ambulatory Visit (HOSPITAL_COMMUNITY)
Admission: EM | Admit: 2020-02-23 | Discharge: 2020-02-23 | Disposition: A | Discharge status: Home / Self Care | Payer: Medicaid Other | Attending: Family Medicine | Admitting: Family Medicine

## 2020-02-23 ENCOUNTER — Other Ambulatory Visit: Payer: Self-pay

## 2020-02-23 ENCOUNTER — Encounter (HOSPITAL_COMMUNITY): Payer: Self-pay | Admitting: Emergency Medicine

## 2020-02-23 DIAGNOSIS — Z20822 Contact with and (suspected) exposure to covid-19: Secondary | ICD-10-CM | POA: Diagnosis not present

## 2020-02-23 DIAGNOSIS — R519 Headache, unspecified: Secondary | ICD-10-CM

## 2020-02-23 DIAGNOSIS — R0981 Nasal congestion: Secondary | ICD-10-CM

## 2020-02-23 DIAGNOSIS — K529 Noninfective gastroenteritis and colitis, unspecified: Secondary | ICD-10-CM | POA: Diagnosis not present

## 2020-02-23 DIAGNOSIS — R5383 Other fatigue: Secondary | ICD-10-CM

## 2020-02-23 DIAGNOSIS — R112 Nausea with vomiting, unspecified: Secondary | ICD-10-CM

## 2020-02-23 DIAGNOSIS — Z87891 Personal history of nicotine dependence: Secondary | ICD-10-CM | POA: Insufficient documentation

## 2020-02-23 DIAGNOSIS — R197 Diarrhea, unspecified: Secondary | ICD-10-CM

## 2020-02-23 DIAGNOSIS — Z882 Allergy status to sulfonamides status: Secondary | ICD-10-CM | POA: Diagnosis not present

## 2020-02-23 DIAGNOSIS — K921 Melena: Secondary | ICD-10-CM

## 2020-02-23 DIAGNOSIS — K644 Residual hemorrhoidal skin tags: Secondary | ICD-10-CM

## 2020-02-23 LAB — SARS CORONAVIRUS 2 (TAT 6-24 HRS): SARS Coronavirus 2: NEGATIVE

## 2020-02-23 MED ORDER — ONDANSETRON 8 MG PO TBDP: 8.0000 mg | ORAL_TABLET | Freq: Three times a day (TID) | ORAL | 0 refills | Status: DC | PRN

## 2020-02-23 MED ORDER — LOPERAMIDE HCL 2 MG PO CAPS: 2.0000 mg | ORAL_CAPSULE | Freq: Two times a day (BID) | ORAL | 0 refills | Status: DC | PRN

## 2020-02-23 MED ORDER — LEVOCETIRIZINE DIHYDROCHLORIDE 5 MG PO TABS: 5.0000 mg | ORAL_TABLET | Freq: Every evening | ORAL | 0 refills | Status: DC

## 2020-02-23 MED ORDER — ONDANSETRON 4 MG PO TBDP
8.0000 mg | ORAL_TABLET | Freq: Once | ORAL | Status: AC
  Administered 2020-02-23: 8 mg via ORAL

## 2020-02-23 MED ORDER — ONDANSETRON 4 MG PO TBDP
ORAL_TABLET | ORAL | Status: AC
  Filled 2020-02-23: qty 2

## 2020-02-23 NOTE — ED Triage Notes (Signed)
PT reports headache, fatigue, diarrhea, and emesis. Symptoms started Friday with diarrhea.

## 2020-02-23 NOTE — Discharge Instructions (Addendum)
Provide a stool sample for [place] as soon as possible, return the kit to our clinic. Make sure you push fluids drinking mostly water but mix it with Gatorade.  Try to eat light meals including soups, broths and soft foods, fruits.  You may use Zofran for your nausea and vomiting once every 8 hours.  Imodium can help with diarrhea but use this carefully limiting it to 1-2 times per day only if you are having a lot of diarrhea. Please return to the clinic if symptoms worsen or you start having severe abdominal pain not helped by taking Tylenol or start having bloody stools or blood in the vomit.  Do not use any nonsteroidal anti-inflammatories (NSAIDs) like ibuprofen, Motrin, naproxen, Aleve, etc. which are all available over-the-counter.  Please just use Tylenol at a dose of 52m-650mg once every 6 hours as needed for your aches, pains, fevers.

## 2020-02-23 NOTE — ED Provider Notes (Signed)
[facility]   MRN: [id number] DOB: 05-Oct-1991  Subjective:   Paul Marshall is a 29 y.o. male presenting for 2 day hx of acute onset nausea with vomiting, diarrhea. Started having a headache, has had decreased appetite. Has not taken any medications. Patient was in the hospital for his girlfriend, she had a blood clot and spent a couple of days there. He was there with her. Has a hx of external hemorrhoids but did have intermittent bloody stools in the past week.  Complete ROS as below.  No current facility-administered medications for this encounter.  Current Outpatient Medications:  .  diphenhydrAMINE (BENADRYL) 25 MG tablet, Take 25 mg by mouth every 6 (six) hours as needed., Disp: , Rfl:    Allergies  Allergen Reactions  . Bactrim Other (See Comments)    Childhood allergy    Past Medical History:  Diagnosis Date  . Asthma      Past Surgical History:  Procedure Laterality Date  . ABDOMINAL SURGERY      No family history on file.  Social History   Tobacco Use  . Smoking status: Former Research scientist (life sciences)  . Smokeless tobacco: Never Used  Substance Use Topics  . Alcohol use: Yes  . Drug use: Yes    Frequency: 3.0 times per week    Types: Marijuana    Review of Systems  Constitutional: Positive for malaise/fatigue. Negative for fever.  HENT: Positive for congestion. Negative for ear pain, sinus pain and sore throat.   Eyes: Negative for discharge and redness.  Respiratory: Negative for cough, hemoptysis, shortness of breath and wheezing.   Cardiovascular: Negative for chest pain.  Gastrointestinal: Positive for blood in stool (occasional, has a hx of hemorrhoids), diarrhea, nausea and vomiting. Negative for abdominal pain and constipation.  Genitourinary: Negative for dysuria, flank pain and hematuria.  Musculoskeletal: Negative for myalgias.  Skin: Negative for rash.  Neurological: Positive for headaches. Negative for dizziness and weakness.    Psychiatric/Behavioral: Negative for depression and substance abuse.     Objective:   Vitals: Pulse 73   Temp 98.3 F (36.8 C) (Oral)   Resp 16   SpO2 98%   Physical Exam Constitutional:      General: He is not in acute distress.    Appearance: Normal appearance. He is well-developed. He is not ill-appearing, toxic-appearing or diaphoretic.  HENT:     Head: Normocephalic and atraumatic.     Right Ear: External ear normal.     Left Ear: External ear normal.     Nose: Nose normal.     Mouth/Throat:     Mouth: Mucous membranes are moist.     Pharynx: Oropharynx is clear.  Eyes:     General: No scleral icterus.       Right eye: No discharge.        Left eye: No discharge.     Extraocular Movements: Extraocular movements intact.     Pupils: Pupils are equal, round, and reactive to light.  Cardiovascular:     Rate and Rhythm: Normal rate and regular rhythm.     Heart sounds: Normal heart sounds. No murmur. No friction rub. No gallop.   Pulmonary:     Effort: Pulmonary effort is normal. No respiratory distress.     Breath sounds: Normal breath sounds. No stridor. No wheezing, rhonchi or rales.  Abdominal:     General: Bowel sounds are normal. There is no distension.     Palpations: Abdomen is soft. There is no mass.  Tenderness: There is generalized abdominal tenderness (generalized) and tenderness in the epigastric area, periumbilical area and suprapubic area. There is no right CVA tenderness, left CVA tenderness, guarding or rebound.  Skin:    General: Skin is warm and dry.  Neurological:     Mental Status: He is alert and oriented to person, place, and time.     Cranial Nerves: No cranial nerve deficit.     Motor: No weakness.     Coordination: Coordination normal.     Gait: Gait normal.     Deep Tendon Reflexes: Reflexes normal.  Psychiatric:        Mood and Affect: Mood normal.        Behavior: Behavior normal.        Thought Content: Thought content normal.         Judgment: Judgment normal.     Assessment and Plan :   PDMP not reviewed this encounter.  1. Gastroenteritis   2. Nausea vomiting and diarrhea   3. Generalized headache   4. Fatigue, unspecified type   5. Nasal congestion   6. External hemorrhoids   7. Bloody stools     COVID-19 testing is pending.  Suspect viral gastroenteritis but patient needs work-up for C. difficile given bloody stools and recent stay with his girlfriend in the hospital.  Use supportive care including fluids, Zofran and loperamide.  Tylenol for fevers aches and pains.  Patient also has trouble with persistent allergies, recommended Xyzal for this. Counseled patient on potential for adverse effects with medications prescribed/recommended today, ER and return-to-clinic precautions discussed, patient verbalized understanding.    Wallis Bamberg, [place] 02/23/20 1218

## 2020-02-25 ENCOUNTER — Telehealth (HOSPITAL_COMMUNITY): Payer: Self-pay

## 2020-02-26 LAB — GASTROINTESTINAL PANEL BY PCR, STOOL (REPLACES STOOL CULTURE)

## 2020-05-22 ENCOUNTER — Encounter (HOSPITAL_COMMUNITY): Payer: Self-pay

## 2020-05-22 ENCOUNTER — Ambulatory Visit (HOSPITAL_COMMUNITY)
Admission: EM | Admit: 2020-05-22 | Discharge: 2020-05-22 | Disposition: A | Discharge status: Home / Self Care | Payer: Medicaid Other | Attending: Family Medicine | Admitting: Family Medicine

## 2020-05-22 ENCOUNTER — Other Ambulatory Visit: Payer: Self-pay

## 2020-05-22 DIAGNOSIS — Z20822 Contact with and (suspected) exposure to covid-19: Secondary | ICD-10-CM | POA: Insufficient documentation

## 2020-05-22 LAB — SARS CORONAVIRUS 2 (TAT 6-24 HRS): SARS Coronavirus 2: NEGATIVE

## 2020-05-22 NOTE — Discharge Instructions (Signed)
Self isolate until covid results are back and negative.  °Will notify you by phone of any positive findings. Your negative results will be sent through your MyChart.     ° °

## 2020-05-22 NOTE — ED Triage Notes (Signed)
Pt presents for covid testing after an exposure 5 days ago by 2 family members; pt states he is not having any symptoms.

## 2020-05-22 NOTE — ED Provider Notes (Signed)
[facility]    CSN: [id number] Arrival date & time: 05/22/20  1308      History   Chief Complaint Chief Complaint  Patient presents with  . Covid Exposure    HPI Paul Marshall is a 29 y.o. male.   Paul Marshall presents with requests for covid testing following a concern for exposure. He played basketball at a gym 5 days ago with his cousin and others. Two days ago his cousin tested positive for covid and notified Jery yesterday. [place] denies any symptoms of covid-19. No history of covid-19 and has not received vaccination.    ROS per HPI, negative if not otherwise mentioned.      Past Medical History:  Diagnosis Date  . Asthma     There are no problems to display for this patient.   Past Surgical History:  Procedure Laterality Date  . ABDOMINAL SURGERY         Home Medications    Prior to Admission medications   Medication Sig Start Date End Date Taking? Authorizing Provider  diphenhydrAMINE (BENADRYL) 25 MG tablet Take 25 mg by mouth every 6 (six) hours as needed.    [provider]  levocetirizine (XYZAL) 5 MG tablet Take 1 tablet (5 mg total) by mouth every evening. 02/23/20   Wallis Bamberg, PA-C  loperamide (IMODIUM) 2 MG capsule Take 1 capsule (2 mg total) by mouth 2 (two) times daily as needed for diarrhea or loose stools. 02/23/20   Wallis Bamberg, PA-C  ondansetron (ZOFRAN-ODT) 8 MG disintegrating tablet Take 1 tablet (8 mg total) by mouth every 8 (eight) hours as needed for nausea or vomiting. 02/23/20   Wallis Bamberg, PA-C    Family History History reviewed. No pertinent family history.  Social History Social History   Tobacco Use  . Smoking status: Former Games developer  . Smokeless tobacco: Never Used  Substance Use Topics  . Alcohol use: Yes  . Drug use: Yes    Frequency: 3.0 times per week    Types: Marijuana     Allergies   Bactrim   Review of Systems Review of Systems   Physical Exam Triage Vital Signs ED  Triage Vitals  Enc Vitals Group     BP 05/22/20 1013 126/73     Pulse Rate 05/22/20 1013 62     Resp 05/22/20 1013 18     Temp 05/22/20 1013 98.3 F (36.8 C)     Temp Source 05/22/20 1013 Oral     SpO2 05/22/20 1013 100 %     Weight --      Height --      Head Circumference --      Peak Flow --      Pain Score 05/22/20 1014 0     Pain Loc --      Pain Edu? --      Excl. in GC? --    No data found.  Updated Vital Signs BP 126/73 (BP Location: Left Arm)   Pulse 62   Temp 98.3 F (36.8 C) (Oral)   Resp 18   SpO2 100%   Visual Acuity Right Eye Distance:   Left Eye Distance:   Bilateral Distance:    Right Eye Near:   Left Eye Near:    Bilateral Near:     Physical Exam Constitutional:      Appearance: He is well-developed.  Cardiovascular:     Rate and Rhythm: Normal rate.  Pulmonary:     Effort: Pulmonary effort  is normal.  Skin:    General: Skin is warm and dry.  Neurological:     Mental Status: He is alert and oriented to person, place, and time.      UC Treatments / Results  Labs (all labs ordered are listed, but only abnormal results are displayed) Labs Reviewed  SARS CORONAVIRUS 2 (TAT 6-24 HRS)    EKG   Radiology No results found.  Procedures Procedures (including critical care time)  Medications Ordered in UC Medications - No data to display  Initial Impression / Assessment and Plan / UC Course  I have reviewed the triage vital signs and the nursing notes.  Pertinent labs & imaging results that were available during my care of the patient were reviewed by me and considered in my medical decision making (see chart for details).     covid testing collected and pending s/p exposure. Return precautions provided. Patient verbalized understanding and agreeable to plan.   Final Clinical Impressions(s) / UC Diagnoses   Final diagnoses:  Exposure to COVID-19 virus  Encounter for laboratory testing for COVID-19 virus     Discharge  Instructions     Self isolate until covid results are back and negative.  Will notify you by phone of any positive findings. Your negative results will be sent through your MyChart.        ED Prescriptions    None     PDMP not reviewed this encounter.   Georgetta Haber, NP 05/22/20 1019

## 2020-07-14 ENCOUNTER — Ambulatory Visit: Payer: Medicaid Other | Attending: Internal Medicine

## 2020-07-14 DIAGNOSIS — Z23 Encounter for immunization: Secondary | ICD-10-CM

## 2020-07-14 NOTE — Progress Notes (Signed)
   Covid-19 Vaccination Clinic  Name:  Lionardo Haze    MRN: [id number] DOB: [birth year]  07/14/2020  Mr. Melgarejo was observed post Covid-19 immunization for 15 minutes without incident. He was provided with Vaccine Information Sheet and instruction to access the V-Safe system.   Mr. Irion was instructed to call 911 with any severe reactions post vaccine: Marland Kitchen Difficulty breathing  . Swelling of face and throat  . A fast heartbeat  . A bad rash all over body  . Dizziness and weakness   Immunizations Administered    Name Date Dose VIS Date Route   Pfizer COVID-19 Vaccine 07/14/2020 11:04 AM 0.3 mL 12/25/2018 Intramuscular   Manufacturer: [employer], [employer]   Lot: [id number]   NDC: [id number]

## 2020-08-04 ENCOUNTER — Ambulatory Visit: Payer: Medicaid Other | Attending: Internal Medicine

## 2020-08-04 DIAGNOSIS — Z23 Encounter for immunization: Secondary | ICD-10-CM

## 2020-08-04 NOTE — Progress Notes (Signed)
   Covid-19 Vaccination Clinic  Name:  Deaundre Allston    MRN: [id number] DOB: 07/05/1991  08/04/2020  Mr. Defrain was observed post Covid-19 immunization for 15 minutes without incident. He was provided with Vaccine Information Sheet and instruction to access the V-Safe system.   Mr. Cuppett was instructed to call 911 with any severe reactions post vaccine: Marland Kitchen Difficulty breathing  . Swelling of face and throat  . A fast heartbeat  . A bad rash all over body  . Dizziness and weakness   Immunizations Administered    Name Date Dose VIS Date Route   Pfizer COVID-19 Vaccine 08/04/2020 12:46 PM 0.3 mL 12/25/2018 Intramuscular   Manufacturer: [employer], [employer]   Lot: [id number]   NDC: [address]-1

## 2020-08-17 ENCOUNTER — Other Ambulatory Visit: Payer: Self-pay

## 2020-08-17 ENCOUNTER — Ambulatory Visit (HOSPITAL_COMMUNITY)
Admission: EM | Admit: 2020-08-17 | Discharge: 2020-08-17 | Disposition: A | Discharge status: Home / Self Care | Payer: HRSA Program | Attending: Family Medicine | Admitting: Family Medicine

## 2020-08-17 ENCOUNTER — Encounter (HOSPITAL_COMMUNITY): Payer: Self-pay

## 2020-08-17 DIAGNOSIS — R112 Nausea with vomiting, unspecified: Secondary | ICD-10-CM | POA: Insufficient documentation

## 2020-08-17 DIAGNOSIS — R197 Diarrhea, unspecified: Secondary | ICD-10-CM | POA: Diagnosis present

## 2020-08-17 DIAGNOSIS — Z87891 Personal history of nicotine dependence: Secondary | ICD-10-CM | POA: Insufficient documentation

## 2020-08-17 DIAGNOSIS — Z79899 Other long term (current) drug therapy: Secondary | ICD-10-CM | POA: Insufficient documentation

## 2020-08-17 DIAGNOSIS — Z20822 Contact with and (suspected) exposure to covid-19: Secondary | ICD-10-CM | POA: Insufficient documentation

## 2020-08-17 DIAGNOSIS — J45909 Unspecified asthma, uncomplicated: Secondary | ICD-10-CM | POA: Insufficient documentation

## 2020-08-17 LAB — SARS CORONAVIRUS 2 (TAT 6-24 HRS): SARS Coronavirus 2: NEGATIVE

## 2020-08-17 MED ORDER — ONDANSETRON 8 MG PO TBDP: 8.0000 mg | ORAL_TABLET | Freq: Three times a day (TID) | ORAL | 0 refills | Status: DC | PRN

## 2020-08-17 NOTE — ED Triage Notes (Signed)
Pt presents with vomiting, diarrhea, and dizziness since waking up this morning.

## 2020-08-17 NOTE — ED Provider Notes (Signed)
[facility]    CSN: [id number] Arrival date & time: 08/17/20  2831      History   Chief Complaint Chief Complaint  Patient presents with  . Vomiting  . Diarrhea  . Dizziness    HPI Paul Marshall is a 29 y.o. male.   Here today with N/V/D, fatigue, mild dizziness since waking up at 4 am this morning. Denies fever, chills, body aches, sore throat, cough, CP, SOB. Has not tried taking anything OTC since onset. Had a sick contact at work a few days ago with similar sxs. No known chronic GI issues.     Past Medical History:  Diagnosis Date  . Asthma     There are no problems to display for this patient.   Past Surgical History:  Procedure Laterality Date  . ABDOMINAL SURGERY         Home Medications    Prior to Admission medications   Medication Sig Start Date End Date Taking? Authorizing Provider  diphenhydrAMINE (BENADRYL) 25 MG tablet Take 25 mg by mouth every 6 (six) hours as needed.    [provider]  levocetirizine (XYZAL) 5 MG tablet Take 1 tablet (5 mg total) by mouth every evening. 02/23/20   Wallis Bamberg, PA-C  loperamide (IMODIUM) 2 MG capsule Take 1 capsule (2 mg total) by mouth 2 (two) times daily as needed for diarrhea or loose stools. 02/23/20   Wallis Bamberg, PA-C  ondansetron (ZOFRAN-ODT) 8 MG disintegrating tablet Take 1 tablet (8 mg total) by mouth every 8 (eight) hours as needed for nausea or vomiting. 08/17/20   Particia Nearing, PA-C    Family History History reviewed. No pertinent family history.  Social History Social History   Tobacco Use  . Smoking status: Former Games developer  . Smokeless tobacco: Never Used  Substance Use Topics  . Alcohol use: Yes  . Drug use: Yes    Frequency: 3.0 times per week    Types: Marijuana     Allergies   Bactrim   Review of Systems Review of Systems PER HPI    Physical Exam Triage Vital Signs ED Triage Vitals [08/17/20 0937]  Enc Vitals Group     BP 123/85      Pulse Rate 79     Resp 17     Temp 98.1 F (36.7 C)     Temp Source Oral     SpO2 99 %     Weight      Height      Head Circumference      Peak Flow      Pain Score 3     Pain Loc      Pain Edu?      Excl. in GC?    No data found.  Updated Vital Signs BP 123/85 (BP Location: Right Arm)   Pulse 79   Temp 98.1 F (36.7 C) (Oral)   Resp 17   SpO2 99%   Visual Acuity Right Eye Distance:   Left Eye Distance:   Bilateral Distance:    Right Eye Near:   Left Eye Near:    Bilateral Near:     Physical Exam Vitals and nursing note reviewed.  Constitutional:      Comments: Appears mildly lethargic, sleeping on exam table when I entered  HENT:     Head: Atraumatic.     Nose: Nose normal.     Mouth/Throat:     Mouth: Mucous membranes are moist.  Pharynx: Oropharynx is clear.  Eyes:     Extraocular Movements: Extraocular movements intact.     Conjunctiva/sclera: Conjunctivae normal.  Cardiovascular:     Rate and Rhythm: Normal rate and regular rhythm.     Heart sounds: Normal heart sounds.  Pulmonary:     Effort: Pulmonary effort is normal.     Breath sounds: Normal breath sounds.  Abdominal:     General: Bowel sounds are normal. There is no distension.     Palpations: Abdomen is soft.     Tenderness: There is abdominal tenderness (epigastric and LUQ ttp). There is no right CVA tenderness, left CVA tenderness, guarding or rebound.  Musculoskeletal:        General: Normal range of motion.     Cervical back: Normal range of motion and neck supple.  Skin:    General: Skin is warm and dry.  Neurological:     General: No focal deficit present.     Mental Status: He is oriented to person, place, and time.  Psychiatric:        Mood and Affect: Mood normal.        Thought Content: Thought content normal.        Judgment: Judgment normal.      UC Treatments / Results  Labs (all labs ordered are listed, but only abnormal results are displayed) Labs Reviewed    SARS CORONAVIRUS 2 (TAT 6-24 HRS)    EKG   Radiology No results found.  Procedures Procedures (including critical care time)  Medications Ordered in UC Medications - No data to display  Initial Impression / Assessment and Plan / UC Course  I have reviewed the triage vital signs and the nursing notes.  Pertinent labs & imaging results that were available during my care of the patient were reviewed by me and considered in my medical decision making (see chart for details).     Suspect viral etiology, COVID pcr pending, note given to quarantine with protocol reviewed. Zofran, imodium, fluids, BRAT diet. Discussed return precautions.   Final Clinical Impressions(s) / UC Diagnoses   Final diagnoses:  Nausea vomiting and diarrhea   Discharge Instructions   None    ED Prescriptions    Medication Sig Dispense Auth. Provider   ondansetron (ZOFRAN-ODT) 8 MG disintegrating tablet Take 1 tablet (8 mg total) by mouth every 8 (eight) hours as needed for nausea or vomiting. 21 tablet Particia Nearing, [place]     PDMP not reviewed this encounter.   Particia Nearing, [place] 08/17/20 1028

## 2020-11-29 ENCOUNTER — Other Ambulatory Visit: Payer: Self-pay

## 2020-11-29 ENCOUNTER — Ambulatory Visit (HOSPITAL_COMMUNITY)
Admission: EM | Admit: 2020-11-29 | Discharge: 2020-11-29 | Disposition: A | Discharge status: Home / Self Care | Payer: HRSA Program | Attending: Family Medicine | Admitting: Family Medicine

## 2020-11-29 ENCOUNTER — Encounter (HOSPITAL_COMMUNITY): Payer: Self-pay

## 2020-11-29 DIAGNOSIS — R519 Headache, unspecified: Secondary | ICD-10-CM | POA: Insufficient documentation

## 2020-11-29 DIAGNOSIS — R112 Nausea with vomiting, unspecified: Secondary | ICD-10-CM | POA: Insufficient documentation

## 2020-11-29 DIAGNOSIS — Z20822 Contact with and (suspected) exposure to covid-19: Secondary | ICD-10-CM | POA: Insufficient documentation

## 2020-11-29 LAB — SARS CORONAVIRUS 2 (TAT 6-24 HRS): SARS Coronavirus 2: NEGATIVE

## 2020-11-29 MED ORDER — KETOROLAC TROMETHAMINE 60 MG/2ML IM SOLN
60.0000 mg | Freq: Once | INTRAMUSCULAR | Status: AC
  Administered 2020-11-29: 60 mg via INTRAMUSCULAR

## 2020-11-29 MED ORDER — KETOROLAC TROMETHAMINE 60 MG/2ML IM SOLN
INTRAMUSCULAR | Status: AC
  Filled 2020-11-29: qty 2

## 2020-11-29 MED ORDER — ONDANSETRON 4 MG PO TBDP: 4.0000 mg | ORAL_TABLET | Freq: Three times a day (TID) | ORAL | 0 refills | Status: DC | PRN

## 2020-11-29 MED ORDER — RIZATRIPTAN BENZOATE 10 MG PO TABS: 10.0000 mg | ORAL_TABLET | ORAL | 0 refills | Status: DC | PRN

## 2020-11-29 NOTE — ED Provider Notes (Signed)
[facility]    CSN: [id number] Arrival date & time: 11/29/20  1020      History   Chief Complaint Chief Complaint  Patient presents with  . Migraine  . Emesis    HPI Paul Marshall is a 30 y.o. male.   Here today with sudden onset frontal headache, photophobia, N/V that started upon waking this morning. Denies dizziness, blurred vision, recent head trauma, congestion, sore throat, fever, cough, weakness, sick contacts, hx of migraines or other neurologic condition. So far has not taken anything OTC for sxs. UTD on vaccines.      Past Medical History:  Diagnosis Date  . Asthma     There are no problems to display for this patient.   Past Surgical History:  Procedure Laterality Date  . ABDOMINAL SURGERY         Home Medications    Prior to Admission medications   Medication Sig Start Date End Date Taking? Authorizing Provider  ondansetron (ZOFRAN ODT) 4 MG disintegrating tablet Take 1 tablet (4 mg total) by mouth every 8 (eight) hours as needed for nausea or vomiting. 11/29/20  Yes Particia Nearing, PA-C  rizatriptan (MAXALT) 10 MG tablet Take 1 tablet (10 mg total) by mouth as needed for migraine. May repeat in 2 hours if needed. Max of 2 tabs daily 11/29/20  Yes Particia Nearing, PA-C  diphenhydrAMINE (BENADRYL) 25 MG tablet Take 25 mg by mouth every 6 (six) hours as needed.    [provider]  levocetirizine (XYZAL) 5 MG tablet Take 1 tablet (5 mg total) by mouth every evening. 02/23/20   Wallis Bamberg, PA-C  loperamide (IMODIUM) 2 MG capsule Take 1 capsule (2 mg total) by mouth 2 (two) times daily as needed for diarrhea or loose stools. 02/23/20   Wallis Bamberg, PA-C  ondansetron (ZOFRAN-ODT) 8 MG disintegrating tablet Take 1 tablet (8 mg total) by mouth every 8 (eight) hours as needed for nausea or vomiting. 08/17/20   Particia Nearing, PA-C    Family History History reviewed. No pertinent family history.  Social  History Social History   Tobacco Use  . Smoking status: Former Games developer  . Smokeless tobacco: Never Used  Substance Use Topics  . Alcohol use: Yes  . Drug use: Yes    Frequency: 3.0 times per week    Types: Marijuana     Allergies   Bactrim   Review of Systems Review of Systems PER HPI   Physical Exam Triage Vital Signs ED Triage Vitals  Enc Vitals Group     BP 11/29/20 1048 130/75     Pulse Rate 11/29/20 1048 70     Resp 11/29/20 1048 16     Temp 11/29/20 1048 98.3 F (36.8 C)     Temp Source 11/29/20 1048 Oral     SpO2 11/29/20 1048 99 %     Weight --      Height --      Head Circumference --      Peak Flow --      Pain Score 11/29/20 1050 8     Pain Loc --      Pain Edu? --      Excl. in GC? --    No data found.  Updated Vital Signs BP 130/75 (BP Location: Right Arm)   Pulse 70   Temp 98.3 F (36.8 C) (Oral)   Resp 16   SpO2 99%   Visual Acuity Right Eye Distance:   Left  Eye Distance:   Bilateral Distance:    Right Eye Near:   Left Eye Near:    Bilateral Near:     Physical Exam Vitals and nursing note reviewed.  Constitutional:      Comments: In dark room, laying on exam table with hood over eyes  HENT:     Head: Atraumatic.     Right Ear: Tympanic membrane normal.     Left Ear: Tympanic membrane normal.     Nose: Nose normal.     Mouth/Throat:     Mouth: Mucous membranes are moist.     Pharynx: Oropharynx is clear.  Eyes:     Extraocular Movements: Extraocular movements intact.     Conjunctiva/sclera: Conjunctivae normal.     Pupils: Pupils are equal, round, and reactive to light.  Cardiovascular:     Rate and Rhythm: Normal rate and regular rhythm.     Heart sounds: Normal heart sounds.  Pulmonary:     Effort: Pulmonary effort is normal. No respiratory distress.     Breath sounds: Normal breath sounds. No wheezing or rales.  Abdominal:     General: Bowel sounds are normal. There is no distension.     Palpations: Abdomen is soft.      Tenderness: There is no abdominal tenderness. There is no right CVA tenderness, left CVA tenderness or guarding.  Musculoskeletal:        General: Normal range of motion.     Cervical back: Normal range of motion and neck supple. No rigidity.  Lymphadenopathy:     Cervical: No cervical adenopathy.  Skin:    General: Skin is warm and dry.     Findings: No erythema or rash.  Neurological:     General: No focal deficit present.     Mental Status: He is oriented to person, place, and time.  Psychiatric:        Mood and Affect: Mood normal.        Thought Content: Thought content normal.        Judgment: Judgment normal.     UC Treatments / Results  Labs (all labs ordered are listed, but only abnormal results are displayed) Labs Reviewed  SARS CORONAVIRUS 2 (TAT 6-24 HRS)    EKG  Radiology No results found.  Procedures Procedures (including critical care time)  Medications Ordered in UC Medications  ketorolac (TORADOL) injection 60 mg (60 mg Intramuscular Given 11/29/20 1132)    Initial Impression / Assessment and Plan / UC Course  I have reviewed the triage vital signs and the nursing notes.  Pertinent labs & imaging results that were available during my care of the patient were reviewed by me and considered in my medical decision making (see chart for details).     No neurologic deficit on exam, possibly new onset migraine vs COVID - will give IM toradol, scripts for maxalt and zofran for home symptomatic improvement, and await COVID pcr results. Work note given, push fluids, tylenol prn. Strict return precautions given for worsening sxs.   Final Clinical Impressions(s) / UC Diagnoses   Final diagnoses:  Acute intractable headache, unspecified headache type  Intractable vomiting with nausea, unspecified vomiting type   Discharge Instructions   None    ED Prescriptions    Medication Sig Dispense Auth. Provider   rizatriptan (MAXALT) 10 MG tablet Take 1  tablet (10 mg total) by mouth as needed for migraine. May repeat in 2 hours if needed. Max of 2 tabs daily 10 tablet  Particia Nearing, PA-C   ondansetron (ZOFRAN ODT) 4 MG disintegrating tablet Take 1 tablet (4 mg total) by mouth every 8 (eight) hours as needed for nausea or vomiting. 20 tablet Particia Nearing, [place]     PDMP not reviewed this encounter.   Particia Nearing, [place] 11/29/20 1159

## 2020-11-29 NOTE — ED Triage Notes (Addendum)
Pt present vomiting and headache, pt states symptoms started this am.  Pt states his head hurts all over and his eye is sensitive to the light

## 2020-12-13 ENCOUNTER — Other Ambulatory Visit: Payer: Self-pay

## 2020-12-13 ENCOUNTER — Ambulatory Visit (HOSPITAL_COMMUNITY)
Admission: EM | Admit: 2020-12-13 | Discharge: 2020-12-13 | Disposition: A | Discharge status: Home / Self Care | Payer: Medicaid Other | Attending: Emergency Medicine | Admitting: Emergency Medicine

## 2020-12-13 ENCOUNTER — Encounter (HOSPITAL_COMMUNITY): Payer: Self-pay

## 2020-12-13 DIAGNOSIS — R110 Nausea: Secondary | ICD-10-CM

## 2020-12-13 DIAGNOSIS — R519 Headache, unspecified: Secondary | ICD-10-CM

## 2020-12-13 MED ORDER — KETOROLAC TROMETHAMINE 60 MG/2ML IM SOLN
60.0000 mg | Freq: Once | INTRAMUSCULAR | Status: AC
  Administered 2020-12-13: 60 mg via INTRAMUSCULAR

## 2020-12-13 MED ORDER — KETOROLAC TROMETHAMINE 60 MG/2ML IM SOLN
INTRAMUSCULAR | Status: AC
  Filled 2020-12-13: qty 2

## 2020-12-13 MED ORDER — ONDANSETRON 4 MG PO TBDP
4.0000 mg | ORAL_TABLET | Freq: Once | ORAL | Status: AC
  Administered 2020-12-13: 4 mg via ORAL

## 2020-12-13 MED ORDER — ONDANSETRON 4 MG PO TBDP
ORAL_TABLET | ORAL | Status: AC
  Filled 2020-12-13: qty 1

## 2020-12-13 NOTE — ED Triage Notes (Addendum)
Pt present severe headache, symptoms started on Friday. Pt states his headache feels like someone is stomping on his head. The headache pain is right behind his eyes with sensitive to light.

## 2020-12-13 NOTE — ED Provider Notes (Signed)
[facility]    CSN: [id number] Arrival date & time: 12/13/20  1005      History   Chief Complaint Chief Complaint  Patient presents with  . Migraine    HPI Paul Marshall is a 30 y.o. male.   Patient presents with 2-day history of intractable headache.  He reports associated nausea; no vomiting.  Treatment attempted at home with sleeping.  He denies focal weakness, numbness, dizziness, fever, chills, chest pain, shortness of breath, abdominal pain, or other symptoms.  Patient was seen here on 11/29/2020; diagnosed with acute intractable headache and intractable nausea with vomiting; treated with Toradol and discharged with Maxalt and Zofran.  Patient states he did not get his prescriptions for Maxalt and Zofran filled because he "is not a pill person."  Patient reports he does not have a PCP.  His medical history also includes asthma.  The history is provided by the patient and medical records.    Past Medical History:  Diagnosis Date  . Asthma     There are no problems to display for this patient.   Past Surgical History:  Procedure Laterality Date  . ABDOMINAL SURGERY         Home Medications    Prior to Admission medications   Medication Sig Start Date End Date Taking? Authorizing Provider  diphenhydrAMINE (BENADRYL) 25 MG tablet Take 25 mg by mouth every 6 (six) hours as needed.    [provider]  levocetirizine (XYZAL) 5 MG tablet Take 1 tablet (5 mg total) by mouth every evening. 02/23/20   Wallis Bamberg, PA-C  loperamide (IMODIUM) 2 MG capsule Take 1 capsule (2 mg total) by mouth 2 (two) times daily as needed for diarrhea or loose stools. 02/23/20   Wallis Bamberg, PA-C  ondansetron (ZOFRAN ODT) 4 MG disintegrating tablet Take 1 tablet (4 mg total) by mouth every 8 (eight) hours as needed for nausea or vomiting. 11/29/20   Particia Nearing, PA-C  ondansetron (ZOFRAN-ODT) 8 MG disintegrating tablet Take 1 tablet (8 mg total) by mouth every 8  (eight) hours as needed for nausea or vomiting. 08/17/20   Particia Nearing, PA-C  rizatriptan (MAXALT) 10 MG tablet Take 1 tablet (10 mg total) by mouth as needed for migraine. May repeat in 2 hours if needed. Max of 2 tabs daily 11/29/20   Particia Nearing, PA-C    Family History History reviewed. No pertinent family history.  Social History Social History   Tobacco Use  . Smoking status: Former Games developer  . Smokeless tobacco: Never Used  Substance Use Topics  . Alcohol use: Yes  . Drug use: Yes    Frequency: 3.0 times per week    Types: Marijuana     Allergies   Bactrim   Review of Systems Review of Systems  Constitutional: Negative for chills and fever.  HENT: Negative for ear pain and sore throat.   Eyes: Negative for pain and visual disturbance.  Respiratory: Negative for cough and shortness of breath.   Cardiovascular: Negative for chest pain and palpitations.  Gastrointestinal: Positive for nausea. Negative for abdominal pain and vomiting.  Genitourinary: Negative for dysuria and hematuria.  Musculoskeletal: Negative for arthralgias and back pain.  Skin: Negative for color change and rash.  Neurological: Positive for headaches. Negative for dizziness, seizures, syncope, weakness and numbness.  All other systems reviewed and are negative.    Physical Exam Triage Vital Signs ED Triage Vitals  Enc Vitals Group     BP  Pulse      Resp      Temp      Temp src      SpO2      Weight      Height      Head Circumference      Peak Flow      Pain Score      Pain Loc      Pain Edu?      Excl. in GC?    No data found.  Updated Vital Signs BP 125/73 (BP Location: Right Arm)   Pulse 85   Temp (!) 97 F (36.1 C) (Oral)   Resp 18   SpO2 97%   Visual Acuity Right Eye Distance:   Left Eye Distance:   Bilateral Distance:    Right Eye Near:   Left Eye Near:    Bilateral Near:     Physical Exam Vitals and nursing note reviewed.   Constitutional:      General: He is not in acute distress.    Appearance: He is well-developed and well-nourished. He is not ill-appearing.  HENT:     Head: Normocephalic and atraumatic.     Mouth/Throat:     Mouth: Mucous membranes are moist.  Eyes:     Extraocular Movements: Extraocular movements intact.     Conjunctiva/sclera: Conjunctivae normal.     Pupils: Pupils are equal, round, and reactive to light.  Cardiovascular:     Rate and Rhythm: Normal rate and regular rhythm.     Heart sounds: Normal heart sounds.  Pulmonary:     Effort: Pulmonary effort is normal. No respiratory distress.     Breath sounds: Normal breath sounds.  Abdominal:     Palpations: Abdomen is soft.     Tenderness: There is no abdominal tenderness.  Musculoskeletal:        General: No edema.     Cervical back: Neck supple.  Skin:    General: Skin is warm and dry.  Neurological:     General: No focal deficit present.     Mental Status: He is alert and oriented to person, place, and time.     Cranial Nerves: No cranial nerve deficit.     Sensory: No sensory deficit.     Motor: No weakness.     Gait: Gait normal.  Psychiatric:        Mood and Affect: Mood and affect and mood normal.        Behavior: Behavior normal.      UC Treatments / Results  Labs (all labs ordered are listed, but only abnormal results are displayed) Labs Reviewed - No data to display  EKG   Radiology No results found.  Procedures Procedures (including critical care time)  Medications Ordered in UC Medications  ketorolac (TORADOL) injection 60 mg (has no administration in time range)  ondansetron (ZOFRAN-ODT) disintegrating tablet 4 mg (has no administration in time range)    Initial Impression / Assessment and Plan / UC Course  I have reviewed the triage vital signs and the nursing notes.  Pertinent labs & imaging results that were available during my care of the patient were reviewed by me and considered in  my medical decision making (see chart for details).   Acute headache, nausea.  Neurological exam reassuring.  Treating with Toradol and dose of Zofran here.  Patient declines prescribed medications at home.  Assistance requested in establishing a PCP.  Instructed patient to follow-up with his new PCP once  established.  Instructed him to go to the ED if he has acute worsening symptoms.  Work note provided for today.  He agrees to plan of care.   Final Clinical Impressions(s) / UC Diagnoses   Final diagnoses:  Acute nonintractable headache, unspecified headache type  Nausea without vomiting     Discharge Instructions     You were given an injection of Toradol and a dose of Zofran here today.    Assistance has been requested for you to obtain a primary care provider.  [place] will call you to help with this.    Follow-up with your new PCP as soon as possible to discuss your recurrent headaches.    Go to the emergency department if you have acute worsening symptoms.        ED Prescriptions    None     PDMP not reviewed this encounter.   Mickie Bail, NP 12/13/20 1102

## 2020-12-13 NOTE — Discharge Instructions (Signed)
You were given an injection of Toradol and a dose of Zofran here today.    Assistance has been requested for you to obtain a primary care provider.  [place] will call you to help with this.    Follow-up with your new PCP as soon as possible to discuss your recurrent headaches.    Go to the emergency department if you have acute worsening symptoms.

## 2020-12-16 ENCOUNTER — Other Ambulatory Visit: Payer: Self-pay

## 2020-12-16 ENCOUNTER — Inpatient Hospital Stay (HOSPITAL_COMMUNITY)
Admission: EM | Admit: 2020-12-16 | Discharge: 2020-12-17 | DRG: 812 | Disposition: A | Discharge status: Home / Self Care | Payer: Medicaid Other | Attending: Internal Medicine | Admitting: Internal Medicine

## 2020-12-16 ENCOUNTER — Encounter (HOSPITAL_COMMUNITY): Payer: Self-pay | Admitting: Emergency Medicine

## 2020-12-16 DIAGNOSIS — Z8719 Personal history of other diseases of the digestive system: Secondary | ICD-10-CM

## 2020-12-16 DIAGNOSIS — D649 Anemia, unspecified: Secondary | ICD-10-CM

## 2020-12-16 DIAGNOSIS — J45909 Unspecified asthma, uncomplicated: Secondary | ICD-10-CM | POA: Diagnosis present

## 2020-12-16 DIAGNOSIS — K589 Irritable bowel syndrome without diarrhea: Secondary | ICD-10-CM | POA: Diagnosis present

## 2020-12-16 DIAGNOSIS — K921 Melena: Secondary | ICD-10-CM | POA: Diagnosis present

## 2020-12-16 DIAGNOSIS — D62 Acute posthemorrhagic anemia: Principal | ICD-10-CM | POA: Diagnosis present

## 2020-12-16 DIAGNOSIS — F1290 Cannabis use, unspecified, uncomplicated: Secondary | ICD-10-CM | POA: Diagnosis present

## 2020-12-16 DIAGNOSIS — D500 Iron deficiency anemia secondary to blood loss (chronic): Secondary | ICD-10-CM | POA: Diagnosis present

## 2020-12-16 DIAGNOSIS — Z79899 Other long term (current) drug therapy: Secondary | ICD-10-CM

## 2020-12-16 DIAGNOSIS — G43909 Migraine, unspecified, not intractable, without status migrainosus: Secondary | ICD-10-CM | POA: Diagnosis present

## 2020-12-16 DIAGNOSIS — K625 Hemorrhage of anus and rectum: Secondary | ICD-10-CM | POA: Diagnosis present

## 2020-12-16 DIAGNOSIS — Z87891 Personal history of nicotine dependence: Secondary | ICD-10-CM

## 2020-12-16 DIAGNOSIS — Z881 Allergy status to other antibiotic agents status: Secondary | ICD-10-CM

## 2020-12-16 DIAGNOSIS — Z20822 Contact with and (suspected) exposure to covid-19: Secondary | ICD-10-CM | POA: Diagnosis present

## 2020-12-16 DIAGNOSIS — R195 Other fecal abnormalities: Secondary | ICD-10-CM | POA: Diagnosis present

## 2020-12-16 DIAGNOSIS — D696 Thrombocytopenia, unspecified: Secondary | ICD-10-CM | POA: Diagnosis present

## 2020-12-16 DIAGNOSIS — K922 Gastrointestinal hemorrhage, unspecified: Secondary | ICD-10-CM | POA: Diagnosis present

## 2020-12-16 DIAGNOSIS — K649 Unspecified hemorrhoids: Secondary | ICD-10-CM

## 2020-12-16 LAB — CBC WITH DIFFERENTIAL/PLATELET
Abs Immature Granulocytes: 0.01 10*3/uL (ref 0.00–0.07)
Basophils Absolute: 0 10*3/uL (ref 0.0–0.1)
Basophils Relative: 1 %
Eosinophils Absolute: 0 10*3/uL (ref 0.0–0.5)
Eosinophils Relative: 1 %
HCT: 25.4 % — ABNORMAL LOW (ref 39.0–52.0)
Hemoglobin: 8.1 g/dL — ABNORMAL LOW (ref 13.0–17.0)
Immature Granulocytes: 0 %
Lymphocytes Relative: 38 %
Lymphs Abs: 1.6 10*3/uL (ref 0.7–4.0)
MCH: 30.3 pg (ref 26.0–34.0)
MCHC: 31.9 g/dL (ref 30.0–36.0)
MCV: 95.1 fL (ref 80.0–100.0)
Monocytes Absolute: 0.3 10*3/uL (ref 0.1–1.0)
Monocytes Relative: 7 %
Neutro Abs: 2.2 10*3/uL (ref 1.7–7.7)
Neutrophils Relative %: 53 %
Platelets: 164 10*3/uL (ref 150–400)
RBC: 2.67 MIL/uL — ABNORMAL LOW (ref 4.22–5.81)
RDW: 12.6 % (ref 11.5–15.5)
WBC: 4.1 10*3/uL (ref 4.0–10.5)
nRBC: 0 % (ref 0.0–0.2)

## 2020-12-16 LAB — COMPREHENSIVE METABOLIC PANEL
ALT: 19 U/L (ref 0–44)
AST: 16 U/L (ref 15–41)
Albumin: 3.7 g/dL (ref 3.5–5.0)
Alkaline Phosphatase: 48 U/L (ref 38–126)
Anion gap: 6 (ref 5–15)
BUN: 16 mg/dL (ref 6–20)
CO2: 25 mmol/L (ref 22–32)
Calcium: 8.7 mg/dL — ABNORMAL LOW (ref 8.9–10.3)
Chloride: 110 mmol/L (ref 98–111)
Creatinine, Ser: 0.91 mg/dL (ref 0.61–1.24)
GFR, Estimated: >60 mL/min (ref >60)
Glucose, Bld: 96 mg/dL (ref 70–99)
Potassium: 4 mmol/L (ref 3.5–5.1)
Sodium: 141 mmol/L (ref 135–145)
Total Bilirubin: 0.1 mg/dL — ABNORMAL LOW (ref 0.3–1.2)
Total Protein: 6.1 g/dL — ABNORMAL LOW (ref 6.5–8.1)

## 2020-12-16 LAB — RESP PANEL BY RT-PCR (FLU A&B, COVID) ARPGX2
Influenza A by PCR: NEGATIVE
Influenza B by PCR: NEGATIVE
SARS Coronavirus 2 by RT PCR: NEGATIVE

## 2020-12-16 LAB — OCCULT BLOOD X 1 CARD TO LAB, STOOL: Fecal Occult Bld: POSITIVE — AB

## 2020-12-16 LAB — PROTIME-INR
INR: 1 (ref 0.8–1.2)
Prothrombin Time: 12.6 seconds (ref 11.4–15.2)

## 2020-12-16 LAB — PREPARE RBC (CROSSMATCH)

## 2020-12-16 LAB — ABO/RH: ABO/RH(D): O NEG

## 2020-12-16 LAB — LIPASE, BLOOD: Lipase: 31 U/L (ref 11–51)

## 2020-12-16 MED ORDER — ONDANSETRON HCL 4 MG PO TABS: 4.0000 mg | ORAL_TABLET | Freq: Four times a day (QID) | ORAL | Status: DC | PRN

## 2020-12-16 MED ORDER — PANTOPRAZOLE SODIUM 40 MG IV SOLR
40.0000 mg | Freq: Once | INTRAVENOUS | Status: AC
  Administered 2020-12-16: 40 mg via INTRAVENOUS
  Filled 2020-12-16: qty 40

## 2020-12-16 MED ORDER — ONDANSETRON HCL 4 MG/2ML IJ SOLN: 4.0000 mg | Freq: Four times a day (QID) | INTRAMUSCULAR | Status: DC | PRN

## 2020-12-16 MED ORDER — ACETAMINOPHEN 325 MG PO TABS: 650.0000 mg | ORAL_TABLET | Freq: Four times a day (QID) | ORAL | Status: DC | PRN

## 2020-12-16 MED ORDER — ACETAMINOPHEN 650 MG RE SUPP: 650.0000 mg | Freq: Four times a day (QID) | RECTAL | Status: DC | PRN

## 2020-12-16 MED ORDER — HYDROCODONE-ACETAMINOPHEN 5-325 MG PO TABS: 1.0000 | ORAL_TABLET | ORAL | Status: DC | PRN

## 2020-12-16 MED ORDER — SODIUM CHLORIDE 0.9 % IV SOLN
10.0000 mL/h | Freq: Once | INTRAVENOUS | Status: AC
  Administered 2020-12-16: 10 mL/h via INTRAVENOUS

## 2020-12-16 MED ORDER — DEXTROSE-NACL 5-0.45 % IV SOLN: INTRAVENOUS | Status: AC

## 2020-12-16 MED ORDER — SODIUM CHLORIDE 0.9 % IV BOLUS
1000.0000 mL | Freq: Once | INTRAVENOUS | Status: AC
  Administered 2020-12-16: 1000 mL via INTRAVENOUS

## 2020-12-16 NOTE — Consult Note (Addendum)
[facility] Consult: 4:47 PM 12/16/2020  LOS: 0 days    Referring Provider: Dr Rodena Medin   Primary Care Physician:  Patient, No Pcp Per Primary Gastroenterologist:  None    Reason for Consultation: Rectal bleeding.  Anemia.   HPI: Paul Marshall is a 30 y.o. male.  Past medical history asthma.  Headachaches.  11/29/2020, 12/13/2020 ED visits for intractable headache, nausea, vomiting..  Treated with Toradol and prescribed outpatient Maxalt and Zofran but did not fill the prescription because "he is not a pill person".  Patient with history of intermittent minor rectal bleeding on rare occasions associated with bowel movements.  He could palpate hemorrhoids and figured the bleeding was from these.  For about a month the bleeding has become much more common again just associated with bowel movements which occur twice a day.  The volume of bleeding can be small to large.  Sometimes the blood is very bright red, other times it is darker.  Some rectal discomfort but no severe pain.  No abdominal pain.  No anorexia.  No hematemesis.  No other unusual bleeding or bruising.  Got dizzy recently. Notes mention he has been taking ibuprofen but he denies this.  While in the ED yesterday BP reading as low as 114/58, 102/51 but today blood pressures are reading 120s to 130s/60s to 70s Hgb 8.1 >> 2 PRBC >> 10.7., MCV 95.   Early this morning his platelets dipped to 144 but the other 2 assays are normal in the 160s. INR, BUN normal. No older labs for comparison.  Does not have a lot of awareness as to family history.  Denies colorectal cancer.  His brother does have hemorrhoids.  Married.  Just started a job 2 weeks ago at [employer], working full-time.  Occasionally smokes marijuana and drinks a few  beers.    Past Medical History:  Diagnosis Date  . Asthma     Past Surgical History:  Procedure Laterality Date  . ABDOMINAL SURGERY      Prior to Admission medications   Medication Sig Start Date End Date Taking? Authorizing Provider  diphenhydrAMINE (BENADRYL) 25 MG tablet Take 25 mg by mouth every 6 (six) hours as needed.    [provider]  levocetirizine (XYZAL) 5 MG tablet Take 1 tablet (5 mg total) by mouth every evening. 02/23/20   Wallis Bamberg, PA-C  loperamide (IMODIUM) 2 MG capsule Take 1 capsule (2 mg total) by mouth 2 (two) times daily as needed for diarrhea or loose stools. 02/23/20   Wallis Bamberg, PA-C  ondansetron (ZOFRAN ODT) 4 MG disintegrating tablet Take 1 tablet (4 mg total) by mouth every 8 (eight) hours as needed for nausea or vomiting. 11/29/20   Particia Nearing, PA-C  ondansetron (ZOFRAN-ODT) 8 MG disintegrating tablet Take 1 tablet (8 mg total) by mouth every 8 (eight) hours as needed for nausea or vomiting. 08/17/20   Particia Nearing, PA-C  rizatriptan (MAXALT) 10 MG tablet Take 1 tablet (10 mg total) by mouth as needed for migraine. May repeat in 2 hours  if needed. Max of 2 tabs daily 11/29/20   Particia Nearing, [place]    Scheduled Meds:  Infusions:  PRN Meds:    Allergies as of 12/16/2020 - Review Complete 12/16/2020  Allergen Reaction Noted  . Bactrim Other (See Comments) 11/16/2011    No family history on file.  Social History   Socioeconomic History  . Marital status: Married    Spouse name: Not on file  . Number of children: Not on file  . Years of education: Not on file  . Highest education level: Not on file  Occupational History  . Not on file  Tobacco Use  . Smoking status: Former Games developer  . Smokeless tobacco: Never Used  Substance and Sexual Activity  . Alcohol use: Yes  . Drug use: Yes    Frequency: 3.0 times per week    Types: Marijuana  . Sexual activity: Not on file  Other Topics Concern  . Not  on file  Social History Narrative  . Not on file   Social Determinants of Health   Financial Resource Strain: Not on file  Food Insecurity: Not on file  Transportation Needs: Not on file  Physical Activity: Not on file  Stress: Not on file  Social Connections: Not on file  Intimate Partner Violence: Not on file    REVIEW OF SYSTEMS: Constitutional: No weakness. ENT:  No nose bleeds.  Occasionally may see scant specks of blood when he blows his nose. Pulm: Shortness of breath or cough CV:  No palpitations, no LE edema.  GU:  No hematuria, no frequency GI: See HPI Heme: Denies unusual or excessive bleeding or bruising. Transfusions: 2 units overnight. Neuro:  No headaches, no peripheral tingling or numbness Derm:  No itching, no rash or sores.  Endocrine:  No sweats or chills.  No polyuria or dysuria Immunization: Not queried. Travel:  None beyond local counties in last few months.    PHYSICAL EXAM: Vital signs in last 24 hours: Vitals:   12/16/20 1628 12/16/20 1643  BP: 118/66 124/71  Pulse: 75 67  Resp: 17 20  Temp: 97.8 F (36.6 C) 97.9 F (36.6 C)  SpO2: 100% 100%   Wt Readings from Last 3 Encounters:  12/16/20 86.2 kg  05/17/15 86.2 kg  03/31/15 83.9 kg    General: Pleasant, comfortable, well-appearing Head: No facial asymmetry or swelling.  No signs of head trauma. Eyes: Scleral icterus.  No conjunctival pallor Ears: Not hard of hearing Nose: No congestion or discharge Mouth: Moist, pink, clear mucosa.  Tongue midline.  Good dentition. Neck: No JVD, no masses, no thyromegaly Lungs: Clear bilaterally Heart: RRR.  No MRG.  S1-S2 present. Abdomen: Soft.  Not tender or distended.  Active bowel sounds.  No masses, HSM, bruits, hernias.   Rectal: Visible small hemorrhoid.  Very tight sphincter tone.  No palpable masses.  No blood on exam glove. Musc/Skeltl: No joint redness, swelling or gross deformity. Extremities: No CCE. Neurologic: Oriented x3.  Alert.   No tremors.  No limb weakness Skin: No rashes or sores Nodes: No cervical adenopathy Psych: Calm, cooperative, pleasant.  Intake/Output from previous day: No intake/output data recorded. Intake/Output this shift: No intake/output data recorded.  LAB RESULTS: Recent Labs    12/16/20 1130  WBC 4.1  HGB 8.1*  HCT 25.4*  PLT 164   BMET Lab Results  Component Value Date   NA 141 12/16/2020   K 4.0 12/16/2020   CL 110 12/16/2020   CO2 25 12/16/2020  GLUCOSE 96 12/16/2020   BUN 16 12/16/2020   CREATININE 0.91 12/16/2020   CALCIUM 8.7 (L) 12/16/2020   LFT Recent Labs    12/16/20 1130  PROT 6.1*  ALBUMIN 3.7  AST 16  ALT 19  ALKPHOS 48  BILITOT 0.1*   PT/INR Lab Results  Component Value Date   INR 1.0 12/16/2020   Hepatitis Panel No results for input(s): HEPBSAG, HCVAB, HEPAIGM, HEPBIGM in the last 72 hours. C-Diff No components found for: CDIFF Lipase     Component Value Date/Time   LIPASE 31 12/16/2020 1449    Drugs of Abuse  No results found for: LABOPIA, COCAINSCRNUR, LABBENZ, AMPHETMU, THCU, LABBARB   RADIOLOGY STUDIES: No results found.   IMPRESSION:   *   Intermittent rectal bleeding.  *    Headaches, nausea, vomiting.    *    Normocytic anemia.  Good response to 2 units PRBCs and patient feeling better after transfusion.  *    Headaches.  Resolved.    PLAN:     *   Anusol supp, colace ordered  *   Not clear he needs colonoscopy at this point but will defer this decision to Dr. Russella Dar.  Patient himself really would like to go home though he is not opposing colonoscopy as an inpatient if necessary.  He is on clear liquids.  If no plans for colonoscopy we can advance him to solid food.   Jennye Moccasin  12/16/2020, 4:47 PM Phone [phone]     Attending Physician Note   I have taken a history, examined the patient and reviewed the chart. I agree with the Advanced Practitioner's note, impression and recommendations.  Intermittent  hematochezia with bowel movements. Likely due to hemorrhoids noted on DRE. R/O proctitis, neoplasm which are much less likely. Unfortunately an anemia panel was not done prior to transfusion.   Recommend: Anusol HS supp bid for 5 days and then bid prn hemorrhoid symptoms  Colace qd Schedule appt with my office for colonoscopy in next couple weeks  Establish with a PCP for ongoing health care OK to stay out of work for 3-4 days post hospital stay Discharge home this evening   Claudette Head, MD [facility] [phone]

## 2020-12-16 NOTE — H&P (Signed)
No pmh, here w/ symptomatic anemia, recent HA"s, nausea, gen fatigue x 5 days, hx hemorrhoids, per last month BRB on toilet tissue, rectal w/blackish stool per ED provider.  NS bolus, 2u prbc's.  Hb 8.1 .  No prior meds, no nsaids.   [facility] History & Physical  Vinson Moselle MD  Referring Provider: Dr. Rodena Medin, Arline Asp Bohle 12/16/2020  Chief Complaint: Headaches x 4-5 days HPI: The patient is a 30 y.o. year-old w/ no PMH reports blood in stool x 1 month, hx hemorrhoids, painless BPBPR off and on.  Lately stool is darker. Pt was here on 11/29/20 dx'd w/ intractable HA and N/V and treated w/ toradol, maxalt and zofran. Pt didn't fill the prescriptions.  Hx of asthma, not on medication though. Per ED provider, rectal showed dark stool quiac +. Hb 8.1 here, vitals signs are stable. Per ED prbc's ordered x 2 and GI is being consulted.  Asked to see for admission.   Pt c/o BRB off and on for a month or so. Some darker stools more recently. Neither him nor wife could say if blood was mixed in with stool. Pt doesn't usually have formed stools though, per the wife he has "explosive" IBS type loose stools all the time.  Pt denies any abd pain or fevers, no hematemesis, no constipation. No SOB , chest pain or wt loss. Denies any other medical problems.  Does not use nsaid's / beecee's, etc on a regular basis, has been taking tylenol or ibuprofen prn the last 5- 7 days for these headaches.   ROS  denies CP  no joint pain   no HA  no blurry vision  no rash  no nausea/ vomiting  no dysuria  no difficulty voiding  Past Medical History  Past Medical History:  Diagnosis Date  . Asthma    Past Surgical History  Past Surgical History:  Procedure Laterality Date  . ABDOMINAL SURGERY     Family History No family history on file. Social History  reports that he has quit smoking. He has never used smokeless tobacco. He reports current alcohol use. He reports current drug use.  Frequency: 3.00 times per week. Drug: Marijuana. Allergies  Allergies  Allergen Reactions  . Bactrim Other (See Comments)    Childhood allergy   Home medications Prior to Admission medications   Medication Sig Start Date End Date Taking? Authorizing Provider  diphenhydrAMINE (BENADRYL) 25 MG tablet Take 25 mg by mouth every 6 (six) hours as needed.    [provider]  levocetirizine (XYZAL) 5 MG tablet Take 1 tablet (5 mg total) by mouth every evening. 02/23/20   Wallis Bamberg, PA-C  loperamide (IMODIUM) 2 MG capsule Take 1 capsule (2 mg total) by mouth 2 (two) times daily as needed for diarrhea or loose stools. 02/23/20   Wallis Bamberg, PA-C  ondansetron (ZOFRAN ODT) 4 MG disintegrating tablet Take 1 tablet (4 mg total) by mouth every 8 (eight) hours as needed for nausea or vomiting. 11/29/20   Particia Nearing, PA-C  ondansetron (ZOFRAN-ODT) 8 MG disintegrating tablet Take 1 tablet (8 mg total) by mouth every 8 (eight) hours as needed for nausea or vomiting. 08/17/20   Particia Nearing, PA-C  rizatriptan (MAXALT) 10 MG tablet Take 1 tablet (10 mg total) by mouth as needed for migraine. May repeat in 2 hours if needed. Max of 2 tabs daily 11/29/20   Particia Nearing, PA-C       Exam Gen  alert, young AAM, no distress, calm No rash, cyanosis or gangrene Sclera anicteric, throat clear   No jvd or bruits Chest clear bilat RRR no MRG Abd soft ntnd no mass or ascites +bs GU normal male MS no joint effusions or deformity Ext no LE edema, no wounds or ulcers Neuro is alert, Ox 3 , nf     Home meds:  - prn benadryl/ prn xyzal/ prn imodium/ prn zofran/ prn maxalt     Assessment/ Plan: 1. Anemia due to GI bleed - Hb 8's, not sure acute and/or chronic.  History + forib BRBPR, hx hemorrhoids, but possible melena here lately.  In ED stool is guiac + and dark per ED staff.  ED consulting GI.  2u prbc's ordered and hanging now.  Hemodynamically stable. No other medical  hx.  No sig long-term nsaid use. F/u Hb q 6 hrs.  2. IBS - per history 3. COVID status - pending PCR result at this time      Vinson Moselle  MD 12/16/2020, 4:11 PM

## 2020-12-16 NOTE — ED Triage Notes (Signed)
Pt reports blood in stool that started 1 month, he does have hemorrhoids but states this is painless bleeding. He reports this happened with every BM, blood is darker red.

## 2020-12-16 NOTE — ED Provider Notes (Signed)
[facility]   CSN: [id number] Arrival date & time: 12/16/20  1031     History Chief Complaint  Patient presents with  . GI Bleeding    Paul Marshall is a 30 y.o. male who presents with concern of 4 days of lightheadedness, headache, nausea, generalized fatigue.  Patient with history of hemorrhoids who states that he has been having painless bright red blood to burgundy colored bleeding with each bowel movement that he has.  He does not have any bleeding when he is not having bowel movements.  He denies any melena, hematochezia.  Denies work-up for GI bleeding in the past.  He denies any chest pain, palpitations, shortness of breath, vomiting, diarrhea.  He does endorse mild generalized abdominal pain that moves around intermittently for the last month.  He denies any dysuria, urinary frequency or urgency.  He denies any fevers or chills at home.  His primary concern at this time is the severe headaches he has been getting for the last several days, as well as the associated lightheadedness.  He was seen by urgent care and given medications for migraine, with some relief.  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  I personally reviewed this patient's medical records.  He is an otherwise healthy 30 year old male who carries no medical diagnoses and is not any medications every day.  He does have history of abdominal surgery as an infant, however is unable to identify which surgery he underwent.  He states it was because he "could not keep any food down", so they did surgery to remedy the cause.  He has not had any abdominal issues since that time.   HPI     Past Medical History:  Diagnosis Date  . Asthma     Patient Active Problem List   Diagnosis Date Noted  . History of hemorrhoids 12/16/2020  . BRBPR (bright red blood per rectum) 12/16/2020  . Guaiac positive  stools 12/16/2020  . Anemia due to GI blood loss 12/16/2020  . IBS (irritable bowel syndrome) 12/16/2020    Past Surgical History:  Procedure Laterality Date  . ABDOMINAL SURGERY         No family history on file.  Social History   Tobacco Use  . Smoking status: Former Games developermoker  . Smokeless tobacco: Never Used  Substance Use Topics  . Alcohol use: Yes  . Drug use: Yes    Frequency: 3.0 times per week    Types: Marijuana    Home Medications Prior to Admission medications   Medication Sig Start Date End Date Taking? Authorizing Provider  diphenhydrAMINE (BENADRYL) 25 MG tablet Take 25 mg by mouth every 6 (six) hours as needed.    [provider]  levocetirizine (XYZAL) 5 MG tablet Take 1 tablet (5 mg total) by mouth every evening. 02/23/20   Wallis BambergMani, Mario, PA-C  loperamide (IMODIUM) 2 MG capsule Take 1 capsule (2 mg total) by mouth 2 (two) times daily as needed for diarrhea or loose stools. 02/23/20   Wallis BambergMani, Mario, PA-C  ondansetron (ZOFRAN ODT) 4 MG disintegrating tablet Take 1 tablet (4 mg total) by mouth every 8 (eight) hours as needed for nausea or vomiting. 11/29/20   Particia NearingLane, Rachel Elizabeth, PA-C  ondansetron (ZOFRAN-ODT) 8 MG disintegrating tablet Take 1 tablet (8 mg total) by mouth every 8 (eight) hours as needed for nausea or vomiting. 08/17/20   Maurice MarchLane,  Salley Hews, PA-C  rizatriptan (MAXALT) 10 MG tablet Take 1 tablet (10 mg total) by mouth as needed for migraine. May repeat in 2 hours if needed. Max of 2 tabs daily 11/29/20   Particia Nearing, PA-C    Allergies    Bactrim  Review of Systems   Review of Systems  Constitutional: Positive for activity change, appetite change and fatigue. Negative for chills, diaphoresis and fever.  HENT: Negative.   Respiratory: Negative.   Cardiovascular: Negative.   Gastrointestinal: Positive for abdominal pain, blood in stool and nausea. Negative for abdominal distention, constipation, diarrhea, rectal pain and  vomiting.  Genitourinary: Negative.   Musculoskeletal: Negative.   Skin: Negative.   Neurological: Positive for light-headedness and headaches.  Psychiatric/Behavioral: Negative.     Physical Exam Updated Vital Signs BP 124/71   Pulse 67   Temp 97.9 F (36.6 C) (Oral)   Resp 20   Ht 5\' 10"  (1.778 m)   Wt 86.2 kg   SpO2 100%   BMI 27.27 kg/m   Physical Exam Vitals and nursing note reviewed. Exam conducted with a chaperone present.  HENT:     Head: Normocephalic and atraumatic.     Right Ear: Hearing normal.     Left Ear: Hearing normal.     Nose: Nose normal.     Mouth/Throat:     Mouth: Mucous membranes are moist.     Pharynx: Oropharynx is clear. Uvula midline. No oropharyngeal exudate or posterior oropharyngeal erythema.     Tonsils: No tonsillar exudate.  Eyes:     General: Lids are normal. Vision grossly intact.        Right eye: No discharge.        Left eye: No discharge.     Extraocular Movements: Extraocular movements intact.     Conjunctiva/sclera: Conjunctivae normal.     Pupils: Pupils are equal, round, and reactive to light.  Neck:     Trachea: Trachea and phonation normal.  Cardiovascular:     Rate and Rhythm: Normal rate and regular rhythm.     Pulses: Normal pulses.          Radial pulses are 2+ on the right side and 2+ on the left side.       Dorsalis pedis pulses are 2+ on the right side and 2+ on the left side.     Heart sounds: Normal heart sounds. No murmur heard.   Pulmonary:     Effort: Pulmonary effort is normal. No respiratory distress.     Breath sounds: Normal breath sounds. No decreased breath sounds, wheezing or rales.  Chest:     Chest wall: No deformity, swelling, tenderness or crepitus.  Abdominal:     General: Bowel sounds are normal. There is no distension.     Palpations: Abdomen is soft.     Tenderness: There is no abdominal tenderness. There is no guarding or rebound.  Genitourinary:    Rectum: Guaiac result positive.  Internal hemorrhoid present. Normal anal tone.  Musculoskeletal:        General: No deformity.     Cervical back: Neck supple. No tenderness or crepitus. No pain with movement or spinous process tenderness.     Right lower leg: No edema.     Left lower leg: No edema.     Comments: Moving all 4 extremities spontaneously and without difficulty.  Lymphadenopathy:     Cervical: No cervical adenopathy.  Skin:    General: Skin is warm and dry.  Capillary Refill: Capillary refill takes less than 2 seconds.  Neurological:     General: No focal deficit present.     Mental Status: He is alert and oriented to person, place, and time. Mental status is at baseline.     Cranial Nerves: Cranial nerves are intact.     Sensory: Sensation is intact.     Motor: Motor function is intact.  Psychiatric:        Mood and Affect: Mood normal.     ED Results / Procedures / Treatments   Labs (all labs ordered are listed, but only abnormal results are displayed) Labs Reviewed  COMPREHENSIVE METABOLIC PANEL - Abnormal; Notable for the following components:      Result Value   Calcium 8.7 (*)    Total Protein 6.1 (*)    Total Bilirubin 0.1 (*)    All other components within normal limits  CBC WITH DIFFERENTIAL/PLATELET - Abnormal; Notable for the following components:   RBC 2.67 (*)    Hemoglobin 8.1 (*)    HCT 25.4 (*)    All other components within normal limits  OCCULT BLOOD X 1 CARD TO LAB, STOOL - Abnormal; Notable for the following components:   Fecal Occult Bld POSITIVE (*)    All other components within normal limits  RESP PANEL BY RT-PCR (FLU A&B, COVID) ARPGX2  LIPASE, BLOOD  PROTIME-INR  CBC  CBC  RAPID URINE DRUG SCREEN, HOSP PERFORMED  POC OCCULT BLOOD, ED  TYPE AND SCREEN  PREPARE RBC (CROSSMATCH)  ABO/RH    EKG EKG Interpretation  Date/Time:  Wednesday December 16 2020 15:05:31 EST Ventricular Rate:  69 PR Interval:    QRS Duration: 93 QT Interval:  379 QTC  Calculation: 406 R Axis:   36 Text Interpretation: Sinus rhythm Confirmed by Kristine Royal [phone]) on 12/16/2020 3:08:38 PM   Radiology No results found.  Procedures .Critical Care Performed by: Paris Lore, PA-C Authorized by: Paris Lore, PA-C   Critical care provider statement:    Critical care time (minutes):  45   Critical care was necessary to treat or prevent imminent or life-threatening deterioration of the following conditions: symptomatic anemia requiring blood transfusion.   Critical care was time spent personally by me on the following activities:  Discussions with consultants, evaluation of patient's response to treatment, examination of patient, ordering and performing treatments and interventions, ordering and review of laboratory studies, ordering and review of radiographic studies, pulse oximetry, re-evaluation of patient's condition, obtaining history from patient or surrogate and review of old charts   Care discussed with: admitting provider       Medications Ordered in ED Medications  dextrose 5 %-0.45 % sodium chloride infusion (has no administration in time range)  acetaminophen (TYLENOL) tablet 650 mg (has no administration in time range)    Or  acetaminophen (TYLENOL) suppository 650 mg (has no administration in time range)  HYDROcodone-acetaminophen (NORCO/VICODIN) 5-325 MG per tablet 1-2 tablet (has no administration in time range)  ondansetron (ZOFRAN) tablet 4 mg (has no administration in time range)    Or  ondansetron (ZOFRAN) injection 4 mg (has no administration in time range)  pantoprazole (PROTONIX) injection 40 mg (40 mg Intravenous Given 12/16/20 1502)  0.9 %  sodium chloride infusion (10 mL/hr Intravenous New Bag/Given 12/16/20 1627)  sodium chloride 0.9 % bolus 1,000 mL (1,000 mLs Intravenous New Bag/Given 12/16/20 1502)    ED Course  I have reviewed the triage vital signs and the nursing notes.  Pertinent labs & imaging  results that were available during my care of the patient were reviewed by me and considered in my medical decision making (see chart for details).  Clinical Course as of 12/16/20 1653  Wed Dec 16, 2020  1620 Consult placed to admitting provider, Dr. Arlean Hopping, Who is agreeable to seeing this patient and admitting him to his service.  Secure chat has been sent to on-call gastroenterologist; awaiting response at this time. [RS]  1631 Received response from GI APP, patient will be seen by their service in the morning. I appreciate their collaboration in the care of this patient.  [RS]    Clinical Course User Index [RS] Mande Auvil, Eugene Gavia, PA-C   MDM Rules/Calculators/A&P                         30 year old male who presents with 4 days of lightheadedness, fatigue, nausea, and 1 month of rectal bleeding with bowel movements.  Vital signs are normal on intake.  Cardiac exam is normal, pulmonary exam is normal, abdominal exam is benign.  Patient is neurovascularly intact in all 4 extremities.  Rectal exam performed in the presence of a chaperone, revealed melanotic appearing stool, fecal occult blood test pending.  Basic laboratory studies obtained in triage.  CBC revealed severe anemia, with hemoglobin 8.1.  CMP unremarkable, lipase normal.  Fecal occult blood test positive.  Will proceed with type and screen, coag studies, and COVID-19 test. Additionally risks and benefits of blood transfusion were discussed with this patient; he voiced understanding and expressed clear wishes to proceed with blood transfusion at this time.  PRBCs ordered.  Given severity of patient's anemia and current symptomatology, patient would benefit from admission to the hospital this time.  Will proceed with secure chat to GI specialist and consult to admitting provider.  Disposition plan discussed with the patient at the bedside; he voiced understanding of his medical evaluation and treatment plan.  Each of his questions  was answered to his expressed satisfaction.  He is amenable to plan for admission to the hospital at this time.  This chart was dictated using voice recognition software, Dragon. Despite the best efforts of this provider to proofread and correct errors, errors may still occur which can change documentation meaning.  Final Clinical Impression(s) / ED Diagnoses Final diagnoses:  None    Rx / DC Orders ED Discharge Orders    None       Sherrilee Gilles 12/16/20 1653    Wynetta Fines, MD 12/18/20 [phone]

## 2020-12-16 NOTE — ED Notes (Signed)
RN called blood bank to get pts 2nd unit of blood. Blood bank informed me that there was a shortage with O's and she will need to contact her medical director to see if it is okay to give me a 2nd unit.

## 2020-12-16 NOTE — ED Notes (Signed)
Blood consent signed and is at bedside

## 2020-12-17 DIAGNOSIS — R195 Other fecal abnormalities: Secondary | ICD-10-CM

## 2020-12-17 DIAGNOSIS — Z8719 Personal history of other diseases of the digestive system: Secondary | ICD-10-CM

## 2020-12-17 DIAGNOSIS — K922 Gastrointestinal hemorrhage, unspecified: Secondary | ICD-10-CM | POA: Diagnosis present

## 2020-12-17 DIAGNOSIS — K625 Hemorrhage of anus and rectum: Secondary | ICD-10-CM

## 2020-12-17 DIAGNOSIS — K580 Irritable bowel syndrome with diarrhea: Secondary | ICD-10-CM

## 2020-12-17 LAB — TYPE AND SCREEN
ABO/RH(D): O NEG
Antibody Screen: NEGATIVE
Unit division: 0
Unit division: 0

## 2020-12-17 LAB — CBC
HCT: 30 % — ABNORMAL LOW (ref 39.0–52.0)
HCT: 32.1 % — ABNORMAL LOW (ref 39.0–52.0)
Hemoglobin: 10.1 g/dL — ABNORMAL LOW (ref 13.0–17.0)
Hemoglobin: 10.7 g/dL — ABNORMAL LOW (ref 13.0–17.0)
MCH: 30.4 pg (ref 26.0–34.0)
MCH: 30.1 pg (ref 26.0–34.0)
MCHC: 33.7 g/dL (ref 30.0–36.0)
MCHC: 33.3 g/dL (ref 30.0–36.0)
MCV: 90.4 fL (ref 80.0–100.0)
MCV: 90.2 fL (ref 80.0–100.0)
Platelets: 144 10*3/uL — ABNORMAL LOW (ref 150–400)
Platelets: 163 10*3/uL (ref 150–400)
RBC: 3.32 MIL/uL — ABNORMAL LOW (ref 4.22–5.81)
RBC: 3.56 MIL/uL — ABNORMAL LOW (ref 4.22–5.81)
RDW: 13.8 % (ref 11.5–15.5)
RDW: 13.9 % (ref 11.5–15.5)
WBC: 5.3 10*3/uL (ref 4.0–10.5)
WBC: 4.8 10*3/uL (ref 4.0–10.5)
nRBC: 0 % (ref 0.0–0.2)
nRBC: 0 % (ref 0.0–0.2)

## 2020-12-17 LAB — BPAM RBC
Blood Product Expiration Date: 202202232359
Blood Product Expiration Date: 202202282359
ISSUE DATE / TIME: 202202161549
ISSUE DATE / TIME: 202202162303
Unit Type and Rh: 9500
Unit Type and Rh: 9500

## 2020-12-17 LAB — HIV ANTIBODY (ROUTINE TESTING W REFLEX): HIV Screen 4th Generation wRfx: NONREACTIVE

## 2020-12-17 MED ORDER — DOCUSATE SODIUM 100 MG PO CAPS
100.0000 mg | ORAL_CAPSULE | Freq: Every day | ORAL | Status: DC
  Administered 2020-12-17: 100 mg via ORAL
  Filled 2020-12-17: qty 1

## 2020-12-17 MED ORDER — HYDROCORTISONE ACETATE 25 MG RE SUPP: 25.0000 mg | Freq: Two times a day (BID) | RECTAL | 0 refills | Status: DC

## 2020-12-17 MED ORDER — DOCUSATE SODIUM 100 MG PO CAPS: 100.0000 mg | ORAL_CAPSULE | Freq: Every day | ORAL | 0 refills | Status: DC

## 2020-12-17 MED ORDER — HYDROCORTISONE ACETATE 25 MG RE SUPP: 25.0000 mg | Freq: Two times a day (BID) | RECTAL | Status: DC

## 2020-12-17 NOTE — Progress Notes (Addendum)
PROGRESS NOTE    Paul Marshall  ZOX:[id number] DOB: 20-Apr-1991 DOA: 12/16/2020 PCP: Paul Marshall, No Pcp Per   Brief Narrative:  Paul Marshall is 30 year old male with no significant past medical history presented to emergency department with blood in stool since 1 month.  History of hemorrhoids, on and off painless blood per rectum.  Takes ibuprofen and Tylenol as needed for headache.  Came to ER on 1/30, 2/13 for intractable headache, nausea and vomiting-treated with Toradol and prescribed outpatient Maxalt and Zofran however he did not fill the prescriptions.  Upon arrival to ED: Paul Marshall's vital signs stable.  Hemoglobin noted to be 8.1, occult blood: Positive.  2 unit PRBC ordered.  GI consulted and Paul Marshall admitted for further evaluation and management.  Assessment & Plan:  Acute blood loss anemia in the setting of GI bleed: -History of hemorrhoids -Paul Marshall presented with intermittent blood in stool since 1 month. -Hemoglobin 8.1 upon arrival.  POC occult blood: Positive -Status post 2 unit PRBC transfusion.  Hemoglobin 10.1 this morning -GI consulted-await recommendations -Avoid NSAIDs and monitor H&H closely and transfuse if hemoglobin less than 8  Thrombocytopenia: Platelet 144 -Likely dilutional.  Repeat CBC tomorrow a.m.  Chronic headaches: -Associated with nausea and vomiting. -Continue Tylenol, Norco as needed for headache and Zofran as needed for nausea and vomiting  Marijuana use: Counseled about cessation  DVT prophylaxis: SCD Code Status: Full code Family Communication:  None present at bedside.  Plan of care discussed with Paul Marshall in length and he verbalized understanding and agreed with it. Disposition Plan: Home in 1 to 2 days  Consultants:   GI  Procedures:   None  Antimicrobials:   None  Status is: Observation  Dispo: The Paul Marshall is from: Home              Anticipated d/c is to: Home              Anticipated d/c date is: 1 day              Paul Marshall  currently is not medically stable to d/c.   Difficult to place Paul Marshall No         Subjective: Paul Marshall seen and examined.  No further episodes of blood per rectum since admission.  Denies epigastric pain, nausea, vomiting, chest pain or shortness of breath.  No acute events overnight.  Objective: Vitals:   12/16/20 2330 12/17/20 0153 12/17/20 0614 12/17/20 1156  BP: 127/78 138/76 124/66 127/73  Pulse: 76 64 62 61  Resp: 18 18 18 19   Temp: 97.8 F (36.6 C) 97.8 F (36.6 C) 97.7 F (36.5 C) 98.2 F (36.8 C)  TempSrc: Oral Oral Oral Oral  SpO2: 99% 100% 100% 100%  Weight:  88.9 kg    Height:  5\' 10"  (1.778 m)      Intake/Output Summary (Last 24 hours) at 12/17/2020 1433 Last data filed at 12/17/2020 1100 Gross per 24 hour  Intake 1134 ml  Output 800 ml  Net 334 ml   Filed Weights   12/16/20 1441 12/17/20 0153  Weight: 86.2 kg 88.9 kg    Examination:  General exam: Appears calm and comfortable, on room air, communicating well Respiratory system: Clear to auscultation. Respiratory effort normal. Cardiovascular system: S1 & S2 heard, RRR. No JVD, murmurs, rubs, gallops or clicks. No pedal edema. Gastrointestinal system: Abdomen is nondistended, soft and nontender. No organomegaly or masses felt. Normal bowel sounds heard. Central nervous system: Alert and oriented. No focal neurological deficits. Extremities: Symmetric 5 x  5 power. Skin: No rashes, lesions or ulcers Psychiatry: Judgement and insight appear normal. Mood & affect appropriate.    Data Reviewed: I have personally reviewed following labs and imaging studies  CBC: Recent Labs  Lab 12/16/20 1130 12/17/20 0443 12/17/20 1025  WBC 4.1 5.3 4.8  NEUTROABS 2.2  --   --   HGB 8.1* 10.1* 10.7*  HCT 25.4* 30.0* 32.1*  MCV 95.1 90.4 90.2  PLT 164 144* 163   Basic Metabolic Panel: Recent Labs  Lab 12/16/20 1130  NA 141  K 4.0  CL 110  CO2 25  GLUCOSE 96  BUN 16  CREATININE 0.91  CALCIUM 8.7*    GFR: Estimated Creatinine Clearance: 134.5 mL/min (by C-G formula based on SCr of 0.91 mg/dL). Liver Function Tests: Recent Labs  Lab 12/16/20 1130  AST 16  ALT 19  ALKPHOS 48  BILITOT 0.1*  PROT 6.1*  ALBUMIN 3.7   Recent Labs  Lab 12/16/20 1449  LIPASE 31   No results for input(s): AMMONIA in the last 168 hours. Coagulation Profile: Recent Labs  Lab 12/16/20 1449  INR 1.0   Cardiac Enzymes: No results for input(s): CKTOTAL, CKMB, CKMBINDEX, TROPONINI in the last 168 hours. BNP (last 3 results) No results for input(s): PROBNP in the last 8760 hours. HbA1C: No results for input(s): HGBA1C in the last 72 hours. CBG: No results for input(s): GLUCAP in the last 168 hours. Lipid Profile: No results for input(s): CHOL, HDL, LDLCALC, TRIG, CHOLHDL, LDLDIRECT in the last 72 hours. Thyroid Function Tests: No results for input(s): TSH, T4TOTAL, FREET4, T3FREE, THYROIDAB in the last 72 hours. Anemia Panel: No results for input(s): VITAMINB12, FOLATE, FERRITIN, TIBC, IRON, RETICCTPCT in the last 72 hours. Sepsis Labs: No results for input(s): PROCALCITON, LATICACIDVEN in the last 168 hours.  Recent Results (from the past 240 hour(s))  Resp Panel by RT-PCR (Flu A&B, Covid) Nasopharyngeal Swab     Status: None   Collection Time: 12/16/20  4:03 PM   Specimen: Nasopharyngeal Swab; Nasopharyngeal(NP) swabs in vial transport medium  Result Value Ref Range Status   SARS Coronavirus 2 by RT PCR NEGATIVE NEGATIVE Final    Comment: (NOTE) SARS-CoV-2 target nucleic acids are NOT DETECTED.  The SARS-CoV-2 RNA is generally detectable in upper respiratory specimens during the acute phase of infection. The lowest concentration of SARS-CoV-2 viral copies this assay can detect is 138 copies/mL. A negative result does not preclude SARS-Cov-2 infection and should not be used as the sole basis for treatment or other Paul Marshall management decisions. A negative result may occur with   improper specimen collection/handling, submission of specimen other than nasopharyngeal swab, presence of viral mutation(s) within the areas targeted by this assay, and inadequate number of viral copies(<138 copies/mL). A negative result must be combined with clinical observations, Paul Marshall history, and epidemiological information. The expected result is Negative.  Fact Sheet for Patients:  BloggerCourse.com  Fact Sheet for Healthcare Providers:  SeriousBroker.it  This test is no t yet approved or cleared by the [place] FDA and  has been authorized for detection and/or diagnosis of SARS-CoV-2 by FDA under an Emergency Use Authorization (EUA). This EUA will remain  in effect (meaning this test can be used) for the duration of the COVID-19 declaration under Section 564(b)(1) of the Act, 21 U.S.C.section 360bbb-3(b)(1), unless the authorization is terminated  or revoked sooner.       Influenza A by PCR NEGATIVE NEGATIVE Final   Influenza B by PCR NEGATIVE NEGATIVE Final  Comment: (NOTE) The Xpert Xpress SARS-CoV-2/FLU/RSV plus assay is intended as an aid in the diagnosis of influenza from Nasopharyngeal swab specimens and should not be used as a sole basis for treatment. Nasal washings and aspirates are unacceptable for Xpert Xpress SARS-CoV-2/FLU/RSV testing.  Fact Sheet for Patients: BloggerCourse.com  Fact Sheet for Healthcare Providers: SeriousBroker.it  This test is not yet approved or cleared by the [place] FDA and has been authorized for detection and/or diagnosis of SARS-CoV-2 by FDA under an Emergency Use Authorization (EUA). This EUA will remain in effect (meaning this test can be used) for the duration of the COVID-19 declaration under Section 564(b)(1) of the Act, 21 U.S.C. section 360bbb-3(b)(1), unless the authorization is terminated  or revoked.  Performed at [facility], [address], [place], [place] [address]       Radiology Studies: No results found.  Scheduled Meds: Continuous Infusions:   LOS: 0 days   Time spent: 35 minutes   Rosamary Boudreau Estill Cotta, MD [facility]  If 7PM-7AM, please contact night-coverage [email or web address] 12/17/2020, 2:33 PM

## 2020-12-17 NOTE — Plan of Care (Signed)

## 2020-12-17 NOTE — Discharge Summary (Addendum)
Physician Discharge Summary  [name] NFA:[id number] DOB: 1991-06-01 DOA: 12/16/2020  PCP: Patient, No Pcp Per  Admit date: 12/16/2020 Discharge date: 12/17/2020  Admitted From: Home Disposition:  Home Recommendations for Outpatient Follow-up:  1. Follow up with PCP in 1-2 weeks week 2. Repeat CBC on follow up visit 3. Follow up with GI outpatient for colonoscopy 4. anusol BID for 5 days & then BID PRN  Home Health:none   Equipment/Devices:none Discharge Condition:stable CODE STATUS:full code Diet recommendation: regular diet  Brief/Interim Summary: Patient is 30 year old male with no significant past medical history presented to emergency department with blood in stool since 1 month.  History of hemorrhoids, on and off painless blood per rectum.  Takes ibuprofen and Tylenol as needed for headache.  Came to ER on 1/30, 2/13 for intractable headache, nausea and vomiting-treated with Toradol and prescribed outpatient Maxalt and Zofran however he did not fill the prescriptions.  Upon arrival to ED: Patient's vital signs stable.  Hemoglobin noted to be 8.1, occult blood: Positive.  2 unit PRBC ordered.  GI consulted and patient admitted for further evaluation and management.  Acute blood loss anemia in the setting of GI bleed: -History of hemorrhoids -Patient presented with intermittent blood in stool since 1 month. -Hemoglobin 8.1 upon arrival.  POC occult blood: Positive -Status post 2 unit PRBC transfusion.  Hemoglobin 10.1 this morning -GI consulted-Recommended Anusol HS BID for 5 days & then BID PRN for hemorrhoids symptoms. Colace QD, Colonoscopy outpatient in next couple of weeks -Okay to DC home from GI standpoint-Appreciate help.  Thrombocytopenia: Platelet 144 -Likely dilutional.  Repeat CBC on follow up visit.  Chronic headaches: -Associated with nausea and vomiting. -started on Tylenol, Norco as needed for headache and Zofran as needed for nausea and  vomiting  Marijuana use: Counseled about cessation  Discharge Diagnoses:  Acute blood loss anemia in the setting of GI bleed Hx of hemorrhoids Chronic headache Hx of marijuana use Thrombocytopenia  Discharge Instructions  Discharge Instructions    Diet general   Complete by: As directed    Discharge instructions   Complete by: As directed    Follow up with PCP in 1 week Repeat CBC on follow up visit Follow up with GI outpatient for colonoscopy   Increase activity slowly   Complete by: As directed      Allergies as of 12/17/2020      Reactions   Bactrim Other (See Comments)   Childhood allergy      Medication List    TAKE these medications   docusate sodium 100 MG capsule Commonly known as: COLACE Take 1 capsule (100 mg total) by mouth daily. Start taking on: December 18, 2020   hydrocortisone 25 MG suppository Commonly known as: ANUSOL-HC Place 1 suppository (25 mg total) rectally 2 (two) times daily. BID for 5 days & then BID PRN Start taking on: December 18, 2020   levocetirizine 5 MG tablet Commonly known as: Xyzal Take 1 tablet (5 mg total) by mouth every evening.   loperamide 2 MG capsule Commonly known as: IMODIUM Take 1 capsule (2 mg total) by mouth 2 (two) times daily as needed for diarrhea or loose stools.   ondansetron 8 MG disintegrating tablet Commonly known as: ZOFRAN-ODT Take 1 tablet (8 mg total) by mouth every 8 (eight) hours as needed for nausea or vomiting.   ondansetron 4 MG disintegrating tablet Commonly known as: Zofran ODT Take 1 tablet (4 mg total) by mouth every 8 (eight) hours as needed  for nausea or vomiting.   rizatriptan 10 MG tablet Commonly known as: Maxalt Take 1 tablet (10 mg total) by mouth as needed for migraine. May repeat in 2 hours if needed. Max of 2 tabs daily       Allergies  Allergen Reactions  . Bactrim Other (See Comments)    Childhood allergy    Consultations:  GI   Procedures/Studies:  No  results found.    Subjective: Patient seen & examined. No further episodes of rectal bleeding. Denies any new complaints & wishes to go home.  Discharge Exam: Vitals:   12/17/20 0614 12/17/20 1156  BP: 124/66 127/73  Pulse: 62 61  Resp: 18 19  Temp: 97.7 F (36.5 C) 98.2 F (36.8 C)  SpO2: 100% 100%   Vitals:   12/16/20 2330 12/17/20 0153 12/17/20 0614 12/17/20 1156  BP: 127/78 138/76 124/66 127/73  Pulse: 76 64 62 61  Resp: 18 18 18 19   Temp: 97.8 F (36.6 C) 97.8 F (36.6 C) 97.7 F (36.5 C) 98.2 F (36.8 C)  TempSrc: Oral Oral Oral Oral  SpO2: 99% 100% 100% 100%  Weight:  88.9 kg    Height:  5\' 10"  (1.778 m)      General: Pt is alert, awake, not in acute distress Cardiovascular: RRR, S1/S2 +, no rubs, no gallops Respiratory: CTA bilaterally, no wheezing, no rhonchi Abdominal: Soft, NT, ND, bowel sounds + Extremities: no edema, no cyanosis    The results of significant diagnostics from this hospitalization (including imaging, microbiology, ancillary and laboratory) are listed below for reference.     Microbiology: Recent Results (from the past 240 hour(s))  Resp Panel by RT-PCR (Flu A&B, Covid) Nasopharyngeal Swab     Status: None   Collection Time: 12/16/20  4:03 PM   Specimen: Nasopharyngeal Swab; Nasopharyngeal(NP) swabs in vial transport medium  Result Value Ref Range Status   SARS Coronavirus 2 by RT PCR NEGATIVE NEGATIVE Final    Comment: (NOTE) SARS-CoV-2 target nucleic acids are NOT DETECTED.  The SARS-CoV-2 RNA is generally detectable in upper respiratory specimens during the acute phase of infection. The lowest concentration of SARS-CoV-2 viral copies this assay can detect is 138 copies/mL. A negative result does not preclude SARS-Cov-2 infection and should not be used as the sole basis for treatment or other patient management decisions. A negative result may occur with  improper specimen collection/handling, submission of specimen other than  nasopharyngeal swab, presence of viral mutation(s) within the areas targeted by this assay, and inadequate number of viral copies(<138 copies/mL). A negative result must be combined with clinical observations, patient history, and epidemiological information. The expected result is Negative.  Fact Sheet for Patients:   Fact Sheet for Healthcare Providers:  12/18/20  This test is no t yet approved or cleared by the [employer] FDA and  has been authorized for detection and/or diagnosis of SARS-CoV-2 by FDA under an Emergency Use Authorization (EUA). This EUA will remain  in effect (meaning this test can be used) for the duration of the COVID-19 declaration under Section 564(b)(1) of the Act, 21 U.S.C.section 360bbb-3(b)(1), unless the authorization is terminated  or revoked sooner.       Influenza A by PCR NEGATIVE NEGATIVE Final   Influenza B by PCR NEGATIVE NEGATIVE Final    Comment: (NOTE) The Xpert Xpress SARS-CoV-2/FLU/RSV plus assay is intended as an aid in the diagnosis of influenza from Nasopharyngeal swab specimens and should not be used as a sole basis for  treatment. Nasal washings and aspirates are unacceptable for Xpert Xpress SARS-CoV-2/FLU/RSV testing.  Fact Sheet for Patients: BloggerCourse.com  Fact Sheet for Healthcare Providers: SeriousBroker.it  This test is not yet approved or cleared by the [place] FDA and has been authorized for detection and/or diagnosis of SARS-CoV-2 by FDA under an Emergency Use Authorization (EUA). This EUA will remain in effect (meaning this test can be used) for the duration of the COVID-19 declaration under Section 564(b)(1) of the Act, 21 U.S.C. section 360bbb-3(b)(1), unless the authorization is terminated or revoked.  Performed at [facility], [address], [place], [place] [address]       Labs: BNP (last 3 results) No results for input(s): BNP in the last 8760 hours. Basic Metabolic Panel: Recent Labs  Lab 12/16/20 1130  NA 141  K 4.0  CL 110  CO2 25  GLUCOSE 96  BUN 16  CREATININE 0.91  CALCIUM 8.7*   Liver Function Tests: Recent Labs  Lab 12/16/20 1130  AST 16  ALT 19  ALKPHOS 48  BILITOT 0.1*  PROT 6.1*  ALBUMIN 3.7   Recent Labs  Lab 12/16/20 1449  LIPASE 31   No results for input(s): AMMONIA in the last 168 hours. CBC: Recent Labs  Lab 12/16/20 1130 12/17/20 0443 12/17/20 1025  WBC 4.1 5.3 4.8  NEUTROABS 2.2  --   --   HGB 8.1* 10.1* 10.7*  HCT 25.4* 30.0* 32.1*  MCV 95.1 90.4 90.2  PLT 164 144* 163   Cardiac Enzymes: No results for input(s): CKTOTAL, CKMB, CKMBINDEX, TROPONINI in the last 168 hours. BNP: Invalid input(s): POCBNP CBG: No results for input(s): GLUCAP in the last 168 hours. D-Dimer No results for input(s): DDIMER in the last 72 hours. Hgb A1c No results for input(s): HGBA1C in the last 72 hours. Lipid Profile No results for input(s): CHOL, HDL, LDLCALC, TRIG, CHOLHDL, LDLDIRECT in the last 72 hours. Thyroid function studies No results for input(s): TSH, T4TOTAL, T3FREE, THYROIDAB in the last 72 hours.  Invalid input(s): FREET3 Anemia work up No results for input(s): VITAMINB12, FOLATE, FERRITIN, TIBC, IRON, RETICCTPCT in the last 72 hours. Urinalysis No results found for: COLORURINE, APPEARANCEUR, LABSPEC, PHURINE, GLUCOSEU, HGBUR, BILIRUBINUR, KETONESUR, PROTEINUR, UROBILINOGEN, NITRITE, LEUKOCYTESUR Sepsis Labs Invalid input(s): PROCALCITONIN,  WBC,  LACTICIDVEN Microbiology Recent Results (from the past 240 hour(s))  Resp Panel by RT-PCR (Flu A&B, Covid) Nasopharyngeal Swab     Status: None   Collection Time: 12/16/20  4:03 PM   Specimen: Nasopharyngeal Swab; Nasopharyngeal(NP) swabs in vial transport medium  Result Value Ref Range Status   SARS Coronavirus 2 by RT PCR NEGATIVE NEGATIVE Final     Comment: (NOTE) SARS-CoV-2 target nucleic acids are NOT DETECTED.  The SARS-CoV-2 RNA is generally detectable in upper respiratory specimens during the acute phase of infection. The lowest concentration of SARS-CoV-2 viral copies this assay can detect is 138 copies/mL. A negative result does not preclude SARS-Cov-2 infection and should not be used as the sole basis for treatment or other patient management decisions. A negative result may occur with  improper specimen collection/handling, submission of specimen other than nasopharyngeal swab, presence of viral mutation(s) within the areas targeted by this assay, and inadequate number of viral copies(<138 copies/mL). A negative result must be combined with clinical observations, patient history, and epidemiological information. The expected result is Negative.  Fact Sheet for Patients:  BloggerCourse.com  Fact Sheet for Healthcare Providers:  SeriousBroker.it  This test is no t yet approved or  cleared by the [place] and  has been authorized for detection and/or diagnosis of SARS-CoV-2 by FDA under an Emergency Use Authorization (EUA). This EUA will remain  in effect (meaning this test can be used) for the duration of the COVID-19 declaration under Section 564(b)(1) of the Act, 21 U.S.C.section 360bbb-3(b)(1), unless the authorization is terminated  or revoked sooner.       Influenza A by PCR NEGATIVE NEGATIVE Final   Influenza B by PCR NEGATIVE NEGATIVE Final    Comment: (NOTE) The Xpert Xpress SARS-CoV-2/FLU/RSV plus assay is intended as an aid in the diagnosis of influenza from Nasopharyngeal swab specimens and should not be used as a sole basis for treatment. Nasal washings and aspirates are unacceptable for Xpert Xpress SARS-CoV-2/FLU/RSV testing.  Fact Sheet for Patients: BloggerCourse.com  Fact Sheet for Healthcare  Providers: SeriousBroker.it  This test is not yet approved or cleared by the [place] FDA and has been authorized for detection and/or diagnosis of SARS-CoV-2 by FDA under an Emergency Use Authorization (EUA). This EUA will remain in effect (meaning this test can be used) for the duration of the COVID-19 declaration under Section 564(b)(1) of the Act, 21 U.S.C. section 360bbb-3(b)(1), unless the authorization is terminated or revoked.  Performed at [facility], [address], [place], [place] [address]      Time coordinating discharge: Over 30 minutes  SIGNED:   Ollen Bowl, MD  [facility] 12/17/2020, 5:20 PM Pager   If 7PM-7AM, please contact night-coverage [email or web address]

## 2021-04-09 ENCOUNTER — Other Ambulatory Visit: Payer: Self-pay

## 2021-04-09 ENCOUNTER — Other Ambulatory Visit (HOSPITAL_COMMUNITY): Payer: Self-pay

## 2021-04-09 ENCOUNTER — Encounter (HOSPITAL_COMMUNITY): Payer: Self-pay | Admitting: Emergency Medicine

## 2021-04-09 ENCOUNTER — Emergency Department (HOSPITAL_COMMUNITY)
Admission: EM | Admit: 2021-04-09 | Discharge: 2021-04-09 | Disposition: A | Discharge status: Home / Self Care | Payer: Self-pay | Attending: Emergency Medicine | Admitting: Emergency Medicine

## 2021-04-09 ENCOUNTER — Emergency Department (HOSPITAL_COMMUNITY): Payer: Self-pay

## 2021-04-09 DIAGNOSIS — R52 Pain, unspecified: Secondary | ICD-10-CM

## 2021-04-09 DIAGNOSIS — R61 Generalized hyperhidrosis: Secondary | ICD-10-CM | POA: Insufficient documentation

## 2021-04-09 DIAGNOSIS — J45909 Unspecified asthma, uncomplicated: Secondary | ICD-10-CM | POA: Insufficient documentation

## 2021-04-09 DIAGNOSIS — Z87891 Personal history of nicotine dependence: Secondary | ICD-10-CM | POA: Insufficient documentation

## 2021-04-09 DIAGNOSIS — U071 COVID-19: Secondary | ICD-10-CM | POA: Insufficient documentation

## 2021-04-09 LAB — RESP PANEL BY RT-PCR (FLU A&B, COVID) ARPGX2
Influenza A by PCR: NEGATIVE
Influenza B by PCR: NEGATIVE
SARS Coronavirus 2 by RT PCR: POSITIVE — AB

## 2021-04-09 LAB — GROUP A STREP BY PCR: Group A Strep by PCR: NOT DETECTED

## 2021-04-09 MED ORDER — NIRMATRELVIR/RITONAVIR (PAXLOVID)TABLET
3.0000 | ORAL_TABLET | Freq: Two times a day (BID) | ORAL | 0 refills | Status: AC
  Filled 2021-04-09: qty 30, 5d supply, fill #0

## 2021-04-09 NOTE — Discharge Instructions (Addendum)
You may take over-the-counter medicine for symptomatic relief, such as Tylenol, Motrin, TheraFlu, Alka seltzer , black elderberry, etc. Please limit acetaminophen (Tylenol) to 4000 mg and Ibuprofen (Motrin, Advil, etc.) to 2400 mg for a 24hr period. Please note that other over-the-counter medicine may contain acetaminophen or ibuprofen as a component of their ingredients.   

## 2021-04-09 NOTE — ED Triage Notes (Addendum)
Pt c/o "a little SOB," sore throat, chills, headache, body aches and cough x1 day. Denies CP, HX asthma. No wheezing in triage. Has been eating/drinking like normal.

## 2021-04-09 NOTE — ED Provider Notes (Signed)
[facility] Provider Note  CSN: [id number] Arrival date & time: 04/09/21 0444  Chief Complaint(s) Cough, myalgia, and Sore Throat  HPI Paul Marshall is a 30 y.o. male   The history is provided by the patient.  Cough Cough characteristics:  Productive Sputum characteristics:  Clear Severity:  Moderate Onset quality:  Gradual Duration:  2 days Timing:  Intermittent Progression:  Worsening Chronicity:  New Relieved by:  Nothing Worsened by:  Nothing Associated symptoms: chills, diaphoresis, headaches (improved with Tylenol), myalgias and sore throat   Associated symptoms: no chest pain and no wheezing    Past Medical History Past Medical History:  Diagnosis Date   Asthma    Patient Active Problem List   Diagnosis Date Noted   GI bleed 12/17/2020   History of hemorrhoids 12/16/2020   BRBPR (bright red blood per rectum) 12/16/2020   Guaiac positive stools 12/16/2020   Anemia due to GI blood loss 12/16/2020   IBS (irritable bowel syndrome) 12/16/2020   Home Medication(s) Prior to Admission medications   Medication Sig Start Date End Date Taking? Authorizing Provider  docusate sodium (COLACE) 100 MG capsule Take 1 capsule (100 mg total) by mouth daily. 12/18/20   Pahwani, Kasandra Knudseninka R, MD  hydrocortisone (ANUSOL-HC) 25 MG suppository Place 1 suppository (25 mg total) rectally 2 (two) times daily. BID for 5 days & then BID PRN 12/18/20   Pahwani, Kasandra Knudseninka R, MD  levocetirizine (XYZAL) 5 MG tablet Take 1 tablet (5 mg total) by mouth every evening. Patient not taking: No sig reported 02/23/20   Wallis BambergMani, Mario, PA-C  loperamide (IMODIUM) 2 MG capsule Take 1 capsule (2 mg total) by mouth 2 (two) times daily as needed for diarrhea or loose stools. Patient not taking: No sig reported 02/23/20   Wallis BambergMani, Mario, PA-C  ondansetron (ZOFRAN ODT) 4 MG disintegrating tablet Take 1 tablet (4 mg total) by mouth every 8 (eight) hours as needed for nausea or  vomiting. Patient not taking: No sig reported 11/29/20   Particia NearingLane, Rachel Elizabeth, PA-C  ondansetron (ZOFRAN-ODT) 8 MG disintegrating tablet Take 1 tablet (8 mg total) by mouth every 8 (eight) hours as needed for nausea or vomiting. Patient not taking: No sig reported 08/17/20   Particia NearingLane, Rachel Elizabeth, PA-C  rizatriptan (MAXALT) 10 MG tablet Take 1 tablet (10 mg total) by mouth as needed for migraine. May repeat in 2 hours if needed. Max of 2 tabs daily Patient not taking: No sig reported 11/29/20   Particia NearingLane, Rachel Elizabeth, PA-C                                                                                                                                    Past Surgical History Past Surgical History:  Procedure Laterality Date   ABDOMINAL SURGERY     Family History History reviewed. No pertinent family history.  Social History Social History   Tobacco Use  Smoking status: Former    Pack years: 0.00   Smokeless tobacco: Never  Substance Use Topics   Alcohol use: Yes   Drug use: Yes    Frequency: 3.0 times per week    Types: Marijuana   Allergies Bactrim  Review of Systems Review of Systems  Constitutional:  Positive for chills and diaphoresis.  HENT:  Positive for sore throat.   Respiratory:  Positive for cough. Negative for wheezing.   Cardiovascular:  Negative for chest pain.  Musculoskeletal:  Positive for myalgias.  Neurological:  Positive for headaches (improved with Tylenol).  All other systems are reviewed and are negative for acute change except as noted in the HPI  Physical Exam Vital Signs  I have reviewed the triage vital signs BP 113/61   Pulse 65   Temp 98.8 F (37.1 C) (Oral)   Resp 12   Ht 5\' 10"  (1.778 m)   Wt 88.9 kg   SpO2 98%   BMI 28.12 kg/m   Physical Exam Vitals reviewed.  Constitutional:      General: He is not in acute distress.    Appearance: He is well-developed. He is not diaphoretic.  HENT:     Head: Normocephalic and atraumatic.      Nose: Nose normal.     Mouth/Throat:     Pharynx: Oropharynx is clear.  Eyes:     General: No scleral icterus.       Right eye: No discharge.        Left eye: No discharge.     Conjunctiva/sclera: Conjunctivae normal.     Pupils: Pupils are equal, round, and reactive to light.  Cardiovascular:     Rate and Rhythm: Normal rate and regular rhythm.     Heart sounds: No murmur heard.   No friction rub. No gallop.  Pulmonary:     Effort: Pulmonary effort is normal. No respiratory distress.     Breath sounds: Normal breath sounds. No stridor. No rales.  Abdominal:     General: There is no distension.     Palpations: Abdomen is soft.     Tenderness: There is no abdominal tenderness.  Musculoskeletal:        General: No tenderness.     Cervical back: Normal range of motion and neck supple.  Skin:    General: Skin is warm and dry.     Findings: No erythema or rash.  Neurological:     Mental Status: He is alert and oriented to person, place, and time.    ED Results and Treatments Labs (all labs ordered are listed, but only abnormal results are displayed) Labs Reviewed  GROUP A STREP BY PCR  RESP PANEL BY RT-PCR (FLU A&B, COVID) [id number]                                                                                                                         EKG  EKG Interpretation  Date/Time:    Ventricular Rate:  PR Interval:    QRS Duration:   QT Interval:    QTC Calculation:   R Axis:     Text Interpretation:          Radiology DG Chest Port 1 View  Result Date: 04/09/2021 CLINICAL DATA:  Shortness of breath with cough EXAM: PORTABLE CHEST 1 VIEW COMPARISON:  None. FINDINGS: Normal heart size and mediastinal contours. No acute infiltrate or edema. No effusion or pneumothorax. Dextroscoliosis no acute osseous findings. IMPRESSION: No evidence of active disease. Electronically Signed   By: Marnee Spring M.D.   On: 04/09/2021 05:55    Pertinent labs & imaging results  that were available during my care of the patient were reviewed by me and considered in my medical decision making (see chart for details).  Medications Ordered in ED Medications - No data to display                                                                                                                                  Procedures Procedures  (including critical care time)  Medical Decision Making / ED Course I have reviewed the nursing notes for this encounter and the patient's prior records (if available in EHR or on provided paperwork).   Paul Marshall was evaluated in Emergency Department on 04/09/2021 for the symptoms described in the history of present illness. He was evaluated in the context of the global COVID-19 pandemic, which necessitated consideration that the patient might be at risk for infection with the SARS-CoV-2 virus that causes COVID-19. Institutional protocols and algorithms that pertain to the evaluation of patients at risk for COVID-19 are in a state of rapid change based on information released by regulatory bodies including the CDC and federal and state organizations. These policies and algorithms were followed during the patient's care in the ED.  Patient presents with viral symptoms for 2 days. Adequate oral hydration. Rest of history as above.  Patient appears well. No signs of toxicity, patient is interactive. No hypoxia, tachypnea or other signs of respiratory distress. No sign of clinical dehydration. Lung exam clear. Rest of exam as above.  CXR w/o PNA. Strep negative. COVID +  Given asthma, good candidate for Paxlovid  Discussed symptomatic treatment with the patient and they will follow closely with their PCP.        Final Clinical Impression(s) / ED Diagnoses Final diagnoses:  Pain    The patient appears reasonably screened and/or stabilized for discharge and I doubt any other medical condition or other [facility] requiring further screening,  evaluation, or treatment in the ED at this time prior to discharge. Safe for discharge with strict return precautions.  Disposition: Discharge  Condition: Good  I have discussed the results, Dx and Tx plan with the patient/family who expressed understanding and agree(s) with the plan. Discharge instructions discussed at length. The patient/family was given strict return precautions who verbalized understanding of the instructions. No  further questions at time of discharge.    ED Discharge Orders          Ordered    nirmatrelvir/ritonavir EUA (PAXLOVID) TABS  2 times daily        04/09/21 4656              Follow Up: Post-COVID Care Clinic at [facility] [address] [place] [address] [phone] Call  to schedule an appointment for close follow up     This chart was dictated using voice recognition software.  Despite best efforts to proofread,  errors can occur which can change the documentation meaning.    Nira Conn, MD 04/09/21 [phone]

## 2021-04-14 ENCOUNTER — Encounter (HOSPITAL_COMMUNITY): Payer: Self-pay | Admitting: Pharmacy Technician

## 2021-04-14 ENCOUNTER — Other Ambulatory Visit: Payer: Self-pay

## 2021-04-14 ENCOUNTER — Emergency Department (HOSPITAL_COMMUNITY)
Admission: EM | Admit: 2021-04-14 | Discharge: 2021-04-14 | Disposition: A | Discharge status: Home / Self Care | Payer: Medicaid Other | Attending: Emergency Medicine | Admitting: Emergency Medicine

## 2021-04-14 DIAGNOSIS — Z87891 Personal history of nicotine dependence: Secondary | ICD-10-CM | POA: Insufficient documentation

## 2021-04-14 DIAGNOSIS — J45909 Unspecified asthma, uncomplicated: Secondary | ICD-10-CM | POA: Insufficient documentation

## 2021-04-14 DIAGNOSIS — K644 Residual hemorrhoidal skin tags: Secondary | ICD-10-CM | POA: Insufficient documentation

## 2021-04-14 MED ORDER — HYDROCORTISONE (PERIANAL) 2.5 % EX CREA: 1.0000 "application " | TOPICAL_CREAM | Freq: Two times a day (BID) | CUTANEOUS | 0 refills | Status: DC

## 2021-04-14 MED ORDER — BENZOCAINE (TOPICAL) 20 % EX OINT: TOPICAL_OINTMENT | CUTANEOUS | 0 refills | Status: DC

## 2021-04-14 NOTE — ED Triage Notes (Signed)
Pt here with reports of ongoing hemorrhoid pain. Wanting to have hemorrhoid removed.

## 2021-04-14 NOTE — ED Notes (Signed)
Pt A&Ox4, ambulatory at d/c with independent steady gait, NAD. Pt verbalized understanding of d/c instructions and follow up care. 

## 2021-04-14 NOTE — Discharge Instructions (Addendum)
Call the general surgeon tomorrow to schedule an appoint to be seen.  Tell them that she were in the ER that you need to be seen in the afternoon clinic

## 2021-04-14 NOTE — ED Notes (Signed)
Pt ambulatory to trama B so Dr. Freida Busman could assess the patient in a private room

## 2021-04-14 NOTE — ED Provider Notes (Signed)
[facility] EMERGENCY DEPARTMENT Provider Note   CSN: [id number] Arrival date & time: 04/14/21  1051     History No chief complaint on file.   Paul Marshall is a 30 y.o. male.  30 year old male who presents with hemorrhoids.  At this present time.  He does use over-the-counter medication without relief.  Called general surgery was told that they would not have any appointments for several weeks.  States his stool has been soft.  Denies any fever or chills.  Denies any rectal discharge      Past Medical History:  Diagnosis Date   Asthma     Patient Active Problem List   Diagnosis Date Noted   GI bleed 12/17/2020   History of hemorrhoids 12/16/2020   BRBPR (bright red blood per rectum) 12/16/2020   Guaiac positive stools 12/16/2020   Anemia due to GI blood loss 12/16/2020   IBS (irritable bowel syndrome) 12/16/2020    Past Surgical History:  Procedure Laterality Date   ABDOMINAL SURGERY         No family history on file.  Social History   Tobacco Use   Smoking status: Former    Pack years: 0.00   Smokeless tobacco: Never  Substance Use Topics   Alcohol use: Yes   Drug use: Yes    Frequency: 3.0 times per week    Types: Marijuana    Home Medications Prior to Admission medications   Medication Sig Start Date End Date Taking? Authorizing Provider  acetaminophen (TYLENOL) 650 MG CR tablet Take 1,300 mg by mouth every 8 (eight) hours as needed for pain or fever.    [provider]  docusate sodium (COLACE) 100 MG capsule Take 1 capsule (100 mg total) by mouth daily. Patient not taking: Reported on 04/09/2021 12/18/20   Ollen Bowl, MD  hydrocortisone (ANUSOL-HC) 25 MG suppository Place 1 suppository (25 mg total) rectally 2 (two) times daily. BID for 5 days & then BID PRN Patient not taking: Reported on 04/09/2021 12/18/20   Pahwani, Kasandra Knudsen, MD  levocetirizine (XYZAL) 5 MG tablet Take 1 tablet (5 mg total) by mouth every  evening. Patient not taking: No sig reported 02/23/20   Wallis Bamberg, PA-C  loperamide (IMODIUM) 2 MG capsule Take 1 capsule (2 mg total) by mouth 2 (two) times daily as needed for diarrhea or loose stools. Patient not taking: No sig reported 02/23/20   Wallis Bamberg, PA-C  nirmatrelvir/ritonavir EUA (PAXLOVID) TABS Take 3 tablets by mouth 2 (two) times daily for 5 days. Take nirmatrelvir (150 mg) two tablets twice daily for 5 days and ritonavir (100 mg) one tablet twice daily for 5 days. 04/09/21 04/14/21  Nira Conn, MD  ondansetron (ZOFRAN ODT) 4 MG disintegrating tablet Take 1 tablet (4 mg total) by mouth every 8 (eight) hours as needed for nausea or vomiting. Patient not taking: No sig reported 11/29/20   Particia Nearing, PA-C  ondansetron (ZOFRAN-ODT) 8 MG disintegrating tablet Take 1 tablet (8 mg total) by mouth every 8 (eight) hours as needed for nausea or vomiting. Patient not taking: No sig reported 08/17/20   Particia Nearing, PA-C  rizatriptan (MAXALT) 10 MG tablet Take 1 tablet (10 mg total) by mouth as needed for migraine. May repeat in 2 hours if needed. Max of 2 tabs daily Patient not taking: No sig reported 11/29/20   Particia Nearing, PA-C    Allergies    Bactrim  Review of Systems   Review  of Systems  All other systems reviewed and are negative.  Physical Exam Updated Vital Signs BP 130/74   Pulse 79   Temp 98.3 F (36.8 C)   Resp 20   SpO2 98%   Physical Exam Vitals and nursing note reviewed.  Constitutional:      General: He is not in acute distress.    Appearance: Normal appearance. He is well-developed. He is not toxic-appearing.  HENT:     Head: Normocephalic and atraumatic.  Eyes:     General: Lids are normal.     Conjunctiva/sclera: Conjunctivae normal.     Pupils: Pupils are equal, round, and reactive to light.  Neck:     Thyroid: No thyroid mass.     Trachea: No tracheal deviation.  Cardiovascular:     Rate and Rhythm:  Normal rate and regular rhythm.     Heart sounds: Normal heart sounds. No murmur heard.   No gallop.  Pulmonary:     Effort: Pulmonary effort is normal. No respiratory distress.     Breath sounds: Normal breath sounds. No stridor. No decreased breath sounds, wheezing, rhonchi or rales.  Abdominal:     General: There is no distension.     Palpations: Abdomen is soft.     Tenderness: There is no abdominal tenderness. There is no rebound.  Genitourinary:    Rectum: External hemorrhoid present.  Musculoskeletal:        General: No tenderness. Normal range of motion.     Cervical back: Normal range of motion and neck supple.  Skin:    General: Skin is warm and dry.     Findings: No abrasion or rash.  Neurological:     Mental Status: He is alert and oriented to person, place, and time. Mental status is at baseline.     GCS: GCS eye subscore is 4. GCS verbal subscore is 5. GCS motor subscore is 6.     Cranial Nerves: Cranial nerves are intact. No cranial nerve deficit.     Sensory: No sensory deficit.     Motor: Motor function is intact.  Psychiatric:        Attention and Perception: Attention normal.        Speech: Speech normal.        Behavior: Behavior normal.    ED Results / Procedures / Treatments   Labs (all labs ordered are listed, but only abnormal results are displayed) Labs Reviewed - No data to display  EKG None  Radiology No results found.  Procedures Procedures   Medications Ordered in ED Medications - No data to display  ED Course  I have reviewed the triage vital signs and the nursing notes.  Pertinent labs & imaging results that were available during my care of the patient were reviewed by me and considered in my medical decision making (see chart for details).    MDM Rules/Calculators/A&P                         Wi prescribe cortisone cream as well as topical anesthetics encourage patient call general surgery today to be seen sooner  Final Clinical  Impression(s) / ED Diagnoses Final diagnoses:  None    Rx / DC Orders ED Discharge Orders     None        Lorre Nick, MD 04/14/21 1530

## 2021-04-14 NOTE — ED Provider Notes (Signed)
Emergency Medicine Provider Triage Evaluation Note  Paul Marshall , a 30 y.o. male  was evaluated in triage.  Pt complains of painful hemorrhoid, no relief with Anusol and over-the-counter treatments.  Unable to see general surgery until the end of July.  Review of Systems  Positive: Rectal pain Negative: Fever  Physical Exam  BP 130/74   Pulse 79   Temp 98.3 F (36.8 C)   Resp 20   SpO2 98%  Gen:   Awake, no distress   Resp:  Normal effort  MSK:   Moves extremities without difficulty  Other:    Medical Decision Making  Medically screening exam initiated at 11:45 AM.  Appropriate orders placed.  Senica Crall was informed that the remainder of the evaluation will be completed by another provider, this initial triage assessment does not replace that evaluation, and the importance of remaining in the ED until their evaluation is complete.     Jeannie Fend, PA-C 04/14/21 1146    Arby Barrette, MD 04/25/21 1233

## 2021-05-21 ENCOUNTER — Ambulatory Visit: Payer: Medicaid Other | Admitting: Gastroenterology

## 2021-12-09 IMAGING — DX DG CHEST 1V PORT
1 series · 1 of 1 positions shown · non-contrast
Comparison: None.

CLINICAL DATA: Shortness of breath with cough

EXAM:
PORTABLE CHEST 1 VIEW

[chest ap]
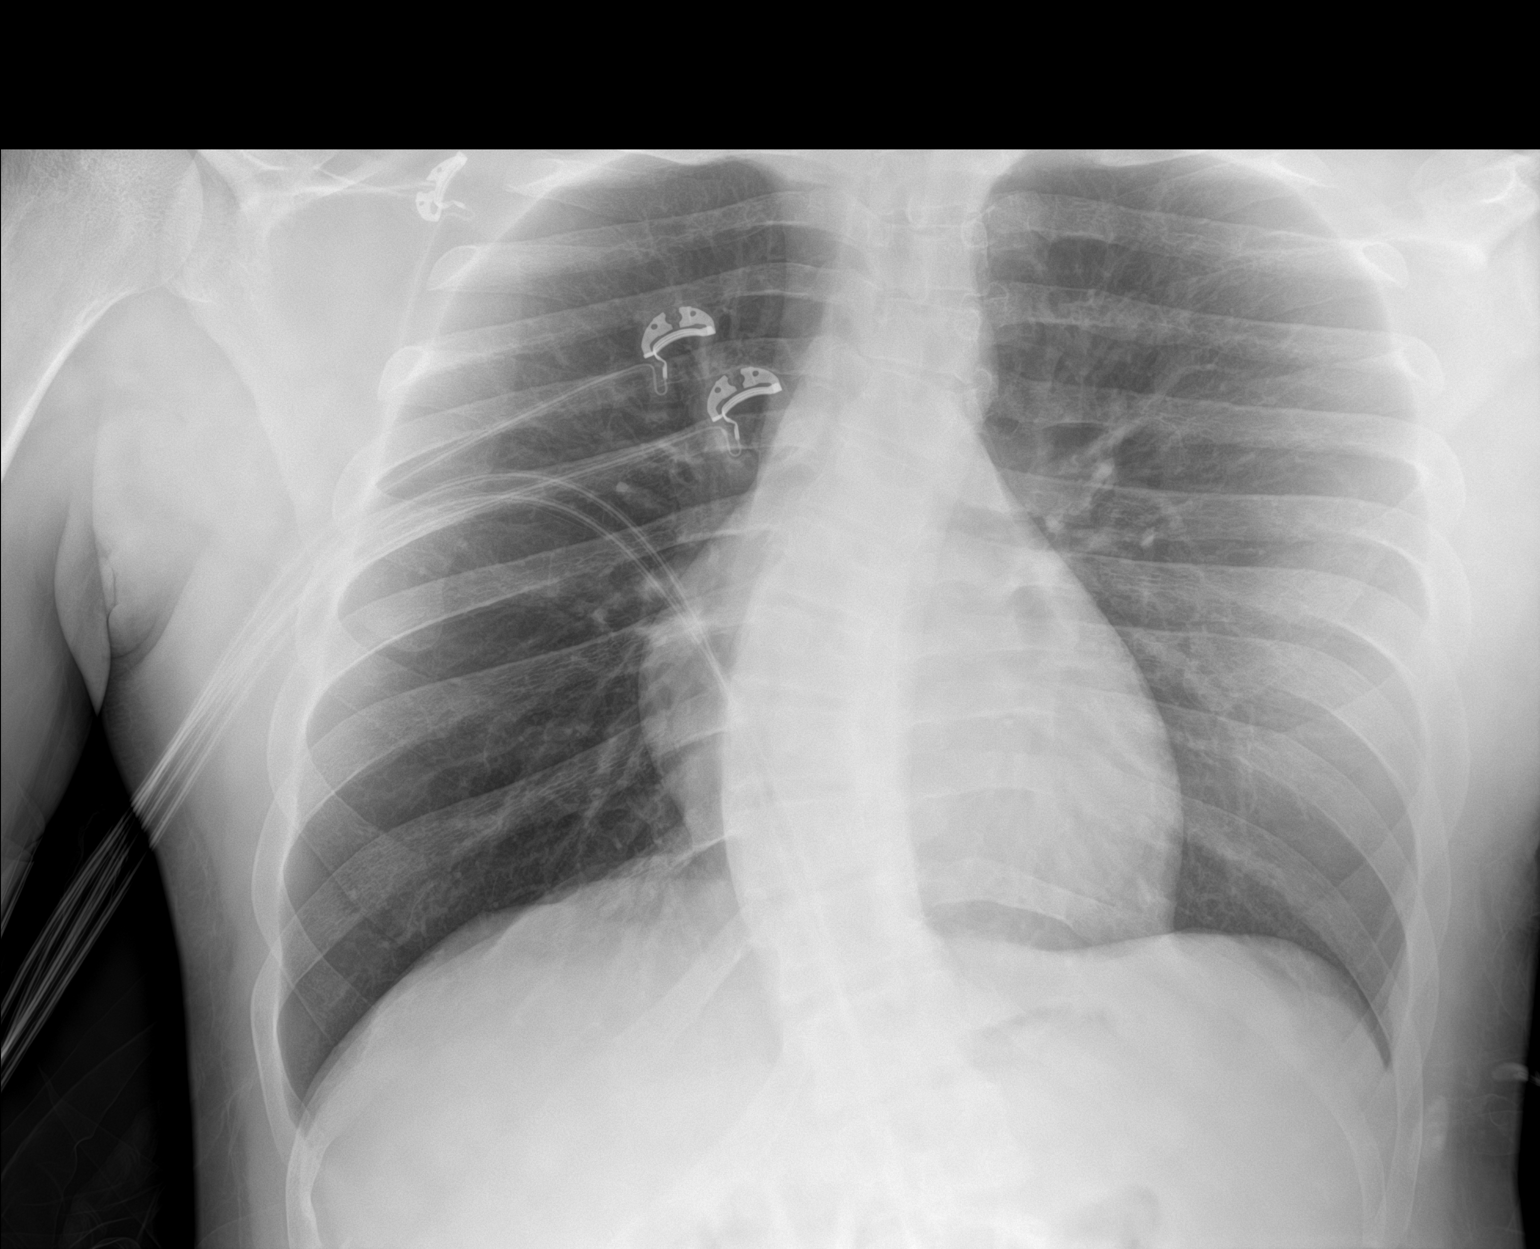

[1 of 1 positions shown; findings below may reference images not displayed]

FINDINGS: Normal heart size and mediastinal contours. No acute infiltrate or
edema. No effusion or pneumothorax. Dextroscoliosis no acute osseous
findings.
IMPRESSION: No evidence of active disease.

## 2022-01-29 ENCOUNTER — Encounter (HOSPITAL_COMMUNITY): Payer: Self-pay | Admitting: Emergency Medicine

## 2022-01-29 ENCOUNTER — Ambulatory Visit (HOSPITAL_COMMUNITY)
Admission: EM | Admit: 2022-01-29 | Discharge: 2022-01-29 | Disposition: A | Discharge status: Home / Self Care | Payer: Self-pay | Attending: Emergency Medicine | Admitting: Emergency Medicine

## 2022-01-29 DIAGNOSIS — R519 Headache, unspecified: Secondary | ICD-10-CM

## 2022-01-29 DIAGNOSIS — J029 Acute pharyngitis, unspecified: Secondary | ICD-10-CM

## 2022-01-29 DIAGNOSIS — R6883 Chills (without fever): Secondary | ICD-10-CM

## 2022-01-29 LAB — POC INFLUENZA A AND B ANTIGEN (URGENT CARE ONLY)
INFLUENZA A ANTIGEN, POC: NEGATIVE
INFLUENZA B ANTIGEN, POC: NEGATIVE

## 2022-01-29 LAB — POCT RAPID STREP A, ED / UC: Streptococcus, Group A Screen (Direct): NEGATIVE

## 2022-01-29 MED ORDER — IBUPROFEN 100 MG/5ML PO SUSP
ORAL | Status: AC
  Filled 2022-01-29: qty 40

## 2022-01-29 MED ORDER — LIDOCAINE VISCOUS HCL 2 % MT SOLN: 15.0000 mL | OROMUCOSAL | 0 refills | Status: DC | PRN

## 2022-01-29 MED ORDER — LIDOCAINE VISCOUS HCL 2 % MT SOLN
15.0000 mL | Freq: Once | OROMUCOSAL | Status: AC
  Administered 2022-01-29: 15 mL via OROMUCOSAL

## 2022-01-29 MED ORDER — LIDOCAINE VISCOUS HCL 2 % MT SOLN
OROMUCOSAL | Status: AC
  Filled 2022-01-29: qty 15

## 2022-01-29 MED ORDER — IBUPROFEN 800 MG PO TABS
800.0000 mg | ORAL_TABLET | Freq: Once | ORAL | Status: AC
  Administered 2022-01-29: 800 mg via ORAL

## 2022-01-29 MED ORDER — IBUPROFEN 400 MG PO TABS: 400.0000 mg | ORAL_TABLET | Freq: Three times a day (TID) | ORAL | 0 refills | Status: DC | PRN

## 2022-01-29 MED ORDER — CEFDINIR 300 MG PO CAPS: 300.0000 mg | ORAL_CAPSULE | Freq: Two times a day (BID) | ORAL | 0 refills | Status: AC

## 2022-01-29 NOTE — ED Triage Notes (Signed)
Pt c/o headache and  sores in back of throat for couple days. Reports chills, fatigue, diarrhea today. [facility] covid test x 2 were negative.  ?

## 2022-01-29 NOTE — Discharge Instructions (Addendum)
Your strep test today is negative.  Throat culture will be performed per our protocol.  The result of your throat culture will be posted to your MyChart once it is complete, this typically takes 3 to 5 days.  Please discard your toothbrush and any other oral devices that you are currently using and replace them with new ones.   ? ?Your rapid influenza test today is also negative. ? ?Because you have blistering on the back of your throat that is concerning for bacterial infection, I recommend that you begin antibiotics at this time.  Please keep in mind that if you do not feel significantly better in the next 48 to 72 hours, the lesions in the back of your throat, by default, would be considered viral and you can discontinue the antibiotics because they will be of no further benefit. ?  ?Please see the list below for recommended medications, dosages and frequencies to provide relief of your current symptoms:   ?  ?Cefdinir (Omnicef):  1 capsule twice daily for 10 days, you can take it with or without food.  This antibiotic can cause upset stomach, this will resolve once antibiotics are complete.  You are welcome to use a probiotic, eat yogurt, take Imodium while taking this medication.  Please avoid other systemic medications such as Maalox, Pepto-Bismol or milk of magnesia as they can interfere with your body's ability to absorb the antibiotics. ?  ?Ibuprofen  (Advil, Motrin): This is a good anti-inflammatory medication which addresses aches, pains and inflammation of the upper airways that causes sinus and nasal congestion as well as in the lower airways which makes your cough feel tight and sometimes burn.  I recommend that you take between 400 to 600 mg every 6-8 hours as needed, I have provided you with a prescription for 400 mg.    ?  ?Lidocaine (Xylocaine): This is a numbing medication that can be swished for 15 seconds and swallowed.  You can use this every 3 hours while awake to relieve pain in your mouth  and throat.  I have sent a prescription for this medication to your pharmacy. ?  ?Please follow-up within the next 5-7 days either with your primary care provider or urgent care if your symptoms do not resolve.  If you do not have a primary care provider, we will assist you in finding one. ?  ?Thank you for visiting urgent care today.  We appreciate the opportunity to participate in your care. ? ?

## 2022-01-29 NOTE — ED Provider Notes (Signed)
?[facility] ? ? ? ?CSN: [id number] ?Arrival date & time: 01/29/22  1643 ?  ? ?HISTORY  ? ?Chief Complaint  ?Patient presents with  ? Headache  ? Chills  ? ?HPI ?Paul Marshall is a 31 y.o. male. Pt c/o headache and  sores in back of throat on the right as well as pain on the right side of his throat when swallowing or turning his head for couple days. Reports chills, fatigue, as well, states he began to have diarrhea today. [facility] covid test x 2 were negative, last test was yesterday.  Patient reports no known sick contacts at work or home.  Patient states his young son has been unwell with fever but he is feeling better at this time and was not evaluated for communicable disease at the time. ? ? ?Past Medical History:  ?Diagnosis Date  ? Asthma   ? ?Patient Active Problem List  ? Diagnosis Date Noted  ? GI bleed 12/17/2020  ? History of hemorrhoids 12/16/2020  ? BRBPR (bright red blood per rectum) 12/16/2020  ? Guaiac positive stools 12/16/2020  ? Anemia due to GI blood loss 12/16/2020  ? IBS (irritable bowel syndrome) 12/16/2020  ? ?Past Surgical History:  ?Procedure Laterality Date  ? ABDOMINAL SURGERY    ? ? ?Home Medications   ? ?Prior to Admission medications   ?Medication Sig Start Date End Date Taking? Authorizing Provider  ?acetaminophen (TYLENOL) 650 MG CR tablet Take 1,300 mg by mouth every 8 (eight) hours as needed for pain or fever.    [provider]  ?Benzocaine 20 % OINT Apply to hemorrhoids every 6 hours as needed for pain 04/14/21   Lorre Nick, MD  ?docusate sodium (COLACE) 100 MG capsule Take 1 capsule (100 mg total) by mouth daily. ?Patient not taking: Reported on 04/09/2021 12/18/20   Ollen Bowl, MD  ?hydrocortisone (ANUSOL-HC) 2.5 % rectal cream Place 1 application rectally 2 (two) times daily. 04/14/21   Lorre Nick, MD  ?hydrocortisone (ANUSOL-HC) 25 MG suppository Place 1 suppository (25 mg total) rectally 2 (two) times daily. BID for 5 days & then BID  PRN ?Patient not taking: Reported on 04/09/2021 12/18/20   Ollen Bowl, MD  ?levocetirizine (XYZAL) 5 MG tablet Take 1 tablet (5 mg total) by mouth every evening. ?Patient not taking: No sig reported 02/23/20   Wallis Bamberg, PA-C  ?loperamide (IMODIUM) 2 MG capsule Take 1 capsule (2 mg total) by mouth 2 (two) times daily as needed for diarrhea or loose stools. ?Patient not taking: No sig reported 02/23/20   Wallis Bamberg, PA-C  ?ondansetron (ZOFRAN ODT) 4 MG disintegrating tablet Take 1 tablet (4 mg total) by mouth every 8 (eight) hours as needed for nausea or vomiting. ?Patient not taking: No sig reported 11/29/20   Particia Nearing, PA-C  ?ondansetron (ZOFRAN-ODT) 8 MG disintegrating tablet Take 1 tablet (8 mg total) by mouth every 8 (eight) hours as needed for nausea or vomiting. ?Patient not taking: No sig reported 08/17/20   Particia Nearing, PA-C  ?rizatriptan (MAXALT) 10 MG tablet Take 1 tablet (10 mg total) by mouth as needed for migraine. May repeat in 2 hours if needed. Max of 2 tabs daily ?Patient not taking: No sig reported 11/29/20   Particia Nearing, PA-C  ? ?Family History ?No family history on file. ?Social History ?Social History  ? ?Tobacco Use  ? Smoking status: Former  ? Smokeless tobacco: Never  ?Substance Use Topics  ? Alcohol use:  Yes  ? Drug use: Yes  ?  Frequency: 3.0 times per week  ?  Types: Marijuana  ? ?Allergies   ?Bactrim ? ?Review of Systems ?Review of Systems ?Pertinent findings noted in history of present illness.  ? ?Physical Exam ?Triage Vital Signs ?ED Triage Vitals  ?Enc Vitals Group  ?   BP 08/27/21 0827 (!) 147/82  ?   Pulse Rate 08/27/21 0827 72  ?   Resp 08/27/21 0827 18  ?   Temp 08/27/21 0827 98.3 ?F (36.8 ?C)  ?   Temp Source 08/27/21 0827 Oral  ?   SpO2 08/27/21 0827 98 %  ?   Weight --   ?   Height --   ?   Head Circumference --   ?   Peak Flow --   ?   Pain Score 08/27/21 0826 5  ?   Pain Loc --   ?   Pain Edu? --   ?   Excl. in GC? --   ?No data  found. ? ?Updated Vital Signs ?BP 124/70 (BP Location: Left Arm)   Pulse 90   Temp 98.9 ?F (37.2 ?C) (Oral)   Resp 14   SpO2 97%  ? ?Physical Exam ?Constitutional:   ?   General: He is not in acute distress. ?   Appearance: He is well-developed. He is ill-appearing. He is not toxic-appearing.  ?HENT:  ?   Head: Normocephalic and atraumatic.  ?   Salivary Glands: Right salivary gland is diffusely enlarged and tender. Left salivary gland is diffusely enlarged and tender.  ?   Right Ear: Hearing and external ear normal.  ?   Left Ear: Hearing and external ear normal.  ?   Ears:  ?   Comments: Bilateral EACs with mild erythema, bilateral TMs are normal ?   Nose: No mucosal edema, congestion or rhinorrhea.  ?   Right Turbinates: Not enlarged, swollen or pale.  ?   Left Turbinates: Not enlarged or swollen.  ?   Right Sinus: No maxillary sinus tenderness or frontal sinus tenderness.  ?   Left Sinus: No maxillary sinus tenderness or frontal sinus tenderness.  ?   Mouth/Throat:  ?   Lips: Pink. No lesions.  ?   Mouth: Mucous membranes are moist. No oral lesions or angioedema.  ?   Dentition: No gingival swelling.  ?   Tongue: No lesions.  ?   Palate: No mass.  ?   Pharynx: Uvula midline. Pharyngeal swelling, oropharyngeal exudate and posterior oropharyngeal erythema present. No uvula swelling.  ?   Tonsils: Tonsillar exudate present. 2+ on the right. 2+ on the left.  ?Eyes:  ?   Extraocular Movements: Extraocular movements intact.  ?   Conjunctiva/sclera: Conjunctivae normal.  ?   Pupils: Pupils are equal, round, and reactive to light.  ?Neck:  ?   Thyroid: No thyroid mass, thyromegaly or thyroid tenderness.  ?   Trachea: Tracheal tenderness present. No abnormal tracheal secretions or tracheal deviation.  ?   Comments: Voice is muffled ?Cardiovascular:  ?   Rate and Rhythm: Normal rate and regular rhythm.  ?   Pulses: Normal pulses.  ?   Heart sounds: Normal heart sounds, S1 normal and S2 normal. No murmur heard. ?  No  friction rub. No gallop.  ?Pulmonary:  ?   Effort: Pulmonary effort is normal. No accessory muscle usage, prolonged expiration, respiratory distress or retractions.  ?   Breath sounds: No stridor, decreased air movement  or transmitted upper airway sounds. No decreased breath sounds, wheezing, rhonchi or rales.  ?Abdominal:  ?   General: Bowel sounds are normal.  ?   Palpations: Abdomen is soft.  ?   Tenderness: There is generalized abdominal tenderness. There is no right CVA tenderness, left CVA tenderness or rebound. Negative signs include Murphy's sign.  ?   Hernia: No hernia is present.  ?Musculoskeletal:     ?   General: No tenderness. Normal range of motion.  ?   Cervical back: Full passive range of motion without pain, normal range of motion and neck supple.  ?   Right lower leg: No edema.  ?   Left lower leg: No edema.  ?Lymphadenopathy:  ?   Cervical: Cervical adenopathy present.  ?   Right cervical: Superficial cervical adenopathy present.  ?   Left cervical: Superficial cervical adenopathy present.  ?Skin: ?   General: Skin is warm and dry.  ?   Findings: No erythema, lesion or rash.  ?Neurological:  ?   General: No focal deficit present.  ?   Mental Status: He is alert and oriented to person, place, and time. Mental status is at baseline.  ?Psychiatric:     ?   Mood and Affect: Mood normal.     ?   Behavior: Behavior normal.     ?   Thought Content: Thought content normal.     ?   Judgment: Judgment normal.  ? ? ?Visual Acuity ?Right Eye Distance:   ?Left Eye Distance:   ?Bilateral Distance:   ? ?Right Eye Near:   ?Left Eye Near:    ?Bilateral Near:    ? ?UC Couse / Diagnostics / Procedures:  ?  ?EKG ? ?Radiology ?No results found. ? ?Procedures ?Procedures (including critical care time) ? ?UC Diagnoses / Final Clinical Impressions(s)   ?I have reviewed the triage vital signs and the nursing notes. ? ?Pertinent labs & imaging results that were available during my care of the patient were reviewed by me  and considered in my medical decision making (see chart for details).   ?Final diagnoses:  ?Acute pharyngitis, unspecified etiology  ?Acute nonintractable headache, unspecified headache type  ?Chills  ? ?Rapid strep test toda

## 2022-02-01 LAB — CULTURE, GROUP A STREP (THRC)

## 2022-08-29 ENCOUNTER — Ambulatory Visit
Admission: EM | Admit: 2022-08-29 | Discharge: 2022-08-29 | Disposition: A | Discharge status: Home / Self Care | Payer: Self-pay | Attending: Emergency Medicine | Admitting: Emergency Medicine

## 2022-08-29 DIAGNOSIS — K529 Noninfective gastroenteritis and colitis, unspecified: Secondary | ICD-10-CM

## 2022-08-29 MED ORDER — ONDANSETRON 4 MG PO TBDP: 4.0000 mg | ORAL_TABLET | Freq: Three times a day (TID) | ORAL | 0 refills | Status: DC | PRN

## 2022-08-29 MED ORDER — IBUPROFEN 400 MG PO TABS: 400.0000 mg | ORAL_TABLET | Freq: Three times a day (TID) | ORAL | 0 refills | Status: DC | PRN

## 2022-08-29 MED ORDER — LOPERAMIDE HCL 2 MG PO TABS: 2.0000 mg | ORAL_TABLET | Freq: Four times a day (QID) | ORAL | 0 refills | Status: DC | PRN

## 2022-08-29 NOTE — ED Triage Notes (Signed)
The patient states for two days he had been having a headache, diarrhea and abd pain.  Home interventions: tylenol

## 2022-08-29 NOTE — ED Provider Notes (Signed)
[facility]    CSN: [id number] Arrival date & time: 08/29/22  1036    HISTORY   Chief Complaint  Patient presents with   Headache   Abdominal Pain   HPI Paul Marshall is a pleasant, 31 y.o. male who presents to urgent care today. The patient states for two days he had been having a headache, diarrhea and abd pain.  Patient also endorses dizziness.  Patient states he took Tylenol without relief of his symptoms.  Patient states his employer sent him home today as he is not feeling well.  Patient states he needs a note for work.  Patient denies fever, body aches, chills, sore throat, runny nose, cough, congestion, ear pain.  Patient states that headache is general patient in dull in quality.  Patient states he has not had any bloody diarrhea or vomiting.  Patient reports his abdominal pain is mild and only occurring prior to episodes of diarrhea.  Patient reports multiple episodes of diarrhea prior to arrival.  The history is provided by the patient.   Past Medical History:  Diagnosis Date   Asthma    Patient Active Problem List   Diagnosis Date Noted   GI bleed 12/17/2020   History of hemorrhoids 12/16/2020   BRBPR (bright red blood per rectum) 12/16/2020   Guaiac positive stools 12/16/2020   Anemia due to GI blood loss 12/16/2020   IBS (irritable bowel syndrome) 12/16/2020   Past Surgical History:  Procedure Laterality Date   ABDOMINAL SURGERY      Home Medications    Prior to Admission medications   Medication Sig Start Date End Date Taking? Authorizing Provider  ibuprofen (ADVIL) 400 MG tablet Take 1 tablet (400 mg total) by mouth every 8 (eight) hours as needed for up to 30 doses. 08/29/22  Yes Theadora Rama Scales, PA-C  loperamide (IMODIUM A-D) 2 MG tablet Take 1 tablet (2 mg total) by mouth 4 (four) times daily as needed for diarrhea or loose stools. 08/29/22  Yes Theadora Rama Scales, PA-C  ondansetron (ZOFRAN-ODT) 4 MG disintegrating tablet Take 1  tablet (4 mg total) by mouth every 8 (eight) hours as needed for nausea or vomiting. 08/29/22  Yes Theadora Rama Scales, PA-C    Family History History reviewed. No pertinent family history. Social History Social History   Tobacco Use   Smoking status: Former   Smokeless tobacco: Never  Building services engineer Use: Never used  Substance Use Topics   Alcohol use: Not Currently   Drug use: Yes    Frequency: 3.0 times per week    Types: Marijuana   Allergies   Bactrim  Review of Systems Review of Systems Pertinent findings revealed after performing a 14 point review of systems has been noted in the history of present illness.  Physical Exam Triage Vital Signs ED Triage Vitals  Enc Vitals Group     BP 08/27/21 0827 (!) 147/82     Pulse Rate 08/27/21 0827 72     Resp 08/27/21 0827 18     Temp 08/27/21 0827 98.3 F (36.8 C)     Temp Source 08/27/21 0827 Oral     SpO2 08/27/21 0827 98 %     Weight --      Height --      Head Circumference --      Peak Flow --      Pain Score 08/27/21 0826 5     Pain Loc --      Pain  Edu? --      Excl. in GC? --   No data found.  Updated Vital Signs BP 114/73 (BP Location: Right Arm)   Pulse (!) 58   Temp 98 F (36.7 C) (Oral)   Resp 16   SpO2 96%   Physical Exam Vitals and nursing note reviewed.  Constitutional:      General: He is not in acute distress.    Appearance: Normal appearance. He is normal weight. He is not ill-appearing.  HENT:     Head: Normocephalic and atraumatic.  Eyes:     Extraocular Movements: Extraocular movements intact.     Conjunctiva/sclera: Conjunctivae normal.     Pupils: Pupils are equal, round, and reactive to light.  Cardiovascular:     Rate and Rhythm: Normal rate and regular rhythm.  Pulmonary:     Effort: Pulmonary effort is normal.     Breath sounds: Normal breath sounds.  Abdominal:     General: Bowel sounds are normal. There is no distension.     Palpations: Abdomen is soft. There is  no mass.     Tenderness: There is no abdominal tenderness. There is no guarding.  Musculoskeletal:        General: Normal range of motion.     Cervical back: Normal range of motion and neck supple.  Skin:    General: Skin is warm and dry.  Neurological:     General: No focal deficit present.     Mental Status: He is alert and oriented to person, place, and time. Mental status is at baseline.  Psychiatric:        Mood and Affect: Mood normal.        Behavior: Behavior normal.        Thought Content: Thought content normal.        Judgment: Judgment normal.     Visual Acuity Right Eye Distance:   Left Eye Distance:   Bilateral Distance:    Right Eye Near:   Left Eye Near:    Bilateral Near:     UC Couse / Diagnostics / Procedures:     Radiology No results found.  Procedures Procedures (including critical care time) EKG  Pending results:  Labs Reviewed - No data to display  Medications Ordered in UC: Medications - No data to display  UC Diagnoses / Final Clinical Impressions(s)   I have reviewed the triage vital signs and the nursing notes.  Pertinent labs & imaging results that were available during my care of the patient were reviewed by me and considered in my medical decision making (see chart for details).    Final diagnoses:  Gastroenteritis   Conservative care recommended.  Patient provided with symptomatic relief with Zofran for nausea and Imodium for episodes of diarrhea.  Patient provided with ibuprofen for 100 mg for relief of abdominal inflammation and headache.  Return precautions advised.  ED Prescriptions     Medication Sig Dispense Auth. Provider   ondansetron (ZOFRAN-ODT) 4 MG disintegrating tablet Take 1 tablet (4 mg total) by mouth every 8 (eight) hours as needed for nausea or vomiting. 20 tablet Theadora Rama Scales, PA-C   loperamide (IMODIUM A-D) 2 MG tablet Take 1 tablet (2 mg total) by mouth 4 (four) times daily as needed for diarrhea or  loose stools. 30 tablet Theadora Rama Scales, PA-C   ibuprofen (ADVIL) 400 MG tablet Take 1 tablet (400 mg total) by mouth every 8 (eight) hours as needed for up to 30 doses.  30 tablet Lynden Oxford Scales, PA-C      PDMP not reviewed this encounter.  Pending results:  Labs Reviewed - No data to display  Discharge Instructions:   Discharge Instructions      Please read below to learn more about the medications, dosages and frequencies that I recommend to help alleviate your symptoms and to get you feeling better soon:   Zofran (ondansetron): This is a good antinausea medication that you can take every 8 hours as needed for symptoms of nausea and vomiting.  It is a dissolvable tablet that you do not need to take with water, just put it under your tongue and let it melt away.  I have sent a prescription to your pharmacy.   Imodium (loperamide): This is a good antidiarrheal medication that you can take after every episode of diarrhea to help slow your bowels and reduce the volume as well as the frequency of diarrhea.  You take this medication up to 4 times daily.   Please follow-up within the next 5-7 days either with your primary care provider or urgent care if your symptoms do not resolve.  If you do not have a primary care provider, we will assist you in finding one.        Thank you for visiting urgent care today.  We appreciate the opportunity to participate in your care.       Disposition Upon Discharge:  Condition: stable for discharge home  Patient presented with an acute illness with associated systemic symptoms and significant discomfort requiring urgent management. In my opinion, this is a condition that a prudent lay person (someone who possesses an average knowledge of health and medicine) may potentially expect to result in complications if not addressed urgently such as respiratory distress, impairment of bodily function or dysfunction of bodily organs.   Routine  symptom specific, illness specific and/or disease specific instructions were discussed with the patient and/or caregiver at length.   As such, the patient has been evaluated and assessed, work-up was performed and treatment was provided in alignment with urgent care protocols and evidence based medicine.  Patient/parent/caregiver has been advised that the patient may require follow up for further testing and treatment if the symptoms continue in spite of treatment, as clinically indicated and appropriate.  Patient/parent/caregiver has been advised to return to the [facility] or PCP if no better; to PCP or the Emergency Department if new signs and symptoms develop, or if the current signs or symptoms continue to change or worsen for further workup, evaluation and treatment as clinically indicated and appropriate  The patient will follow up with their current PCP if and as advised. If the patient does not currently have a PCP we will assist them in obtaining one.   The patient may need specialty follow up if the symptoms continue, in spite of conservative treatment and management, for further workup, evaluation, consultation and treatment as clinically indicated and appropriate.   Patient/parent/caregiver verbalized understanding and agreement of plan as discussed.  All questions were addressed during visit.  Please see discharge instructions below for further details of plan.  This office note has been dictated using Museum/gallery curator.  Unfortunately, this method of dictation can sometimes lead to typographical or grammatical errors.  I apologize for your inconvenience in advance if this occurs.  Please do not hesitate to reach out to me if clarification is needed.      Lynden Oxford Scales, PA-C 08/30/22 2004

## 2022-08-29 NOTE — Discharge Instructions (Signed)
Please read below to learn more about the medications, dosages and frequencies that I recommend to help alleviate your symptoms and to get you feeling better soon:   Zofran (ondansetron): This is a good antinausea medication that you can take every 8 hours as needed for symptoms of nausea and vomiting.  It is a dissolvable tablet that you do not need to take with water, just put it under your tongue and let it melt away.  I have sent a prescription to your pharmacy.   Imodium (loperamide): This is a good antidiarrheal medication that you can take after every episode of diarrhea to help slow your bowels and reduce the volume as well as the frequency of diarrhea.  You take this medication up to 4 times daily.   Please follow-up within the next 5-7 days either with your primary care provider or urgent care if your symptoms do not resolve.  If you do not have a primary care provider, we will assist you in finding one.        Thank you for visiting urgent care today.  We appreciate the opportunity to participate in your care.    

## 2023-04-12 ENCOUNTER — Ambulatory Visit
Admission: EM | Admit: 2023-04-12 | Discharge: 2023-04-12 | Disposition: A | Discharge status: Home / Self Care | Payer: Self-pay | Attending: Urgent Care | Admitting: Urgent Care

## 2023-04-12 ENCOUNTER — Ambulatory Visit (INDEPENDENT_AMBULATORY_CARE_PROVIDER_SITE_OTHER): Payer: Self-pay

## 2023-04-12 DIAGNOSIS — S9031XA Contusion of right foot, initial encounter: Secondary | ICD-10-CM

## 2023-04-12 DIAGNOSIS — S86911A Strain of unspecified muscle(s) and tendon(s) at lower leg level, right leg, initial encounter: Secondary | ICD-10-CM

## 2023-04-12 MED ORDER — NAPROXEN 500 MG PO TABS: 500.0000 mg | ORAL_TABLET | Freq: Two times a day (BID) | ORAL | 0 refills | Status: DC

## 2023-04-12 NOTE — ED Triage Notes (Signed)
Pt reports pain in the right knee and right heel x 2 days. Reports he was kicking non stop a metal door  as he felt angry. Reports he can not put pressure in the right foot.

## 2023-04-12 NOTE — ED Provider Notes (Signed)
[facility]  Note:  This document was prepared using Conservation officer, historic buildings and may include unintentional dictation errors.  MRN: [id number] DOB: Mar 23, 1991  Subjective:   Paul Marshall is a 32 y.o. male presenting for 2-day history of acute onset persistent right knee pain, right heel pain.  Patient tried to get a metal door and ended up not reaching correctly made significant impact against the heel which also hurt his knee.  He has since had pain of the heel and knee.  Has significant difficulty bearing weight but is able to walk bearing weight on the ball of his foot, limping.  No current facility-administered medications for this encounter.  Current Outpatient Medications:    ibuprofen (ADVIL) 400 MG tablet, Take 1 tablet (400 mg total) by mouth every 8 (eight) hours as needed for up to 30 doses., Disp: 30 tablet, Rfl: 0   loperamide (IMODIUM A-D) 2 MG tablet, Take 1 tablet (2 mg total) by mouth 4 (four) times daily as needed for diarrhea or loose stools., Disp: 30 tablet, Rfl: 0   ondansetron (ZOFRAN-ODT) 4 MG disintegrating tablet, Take 1 tablet (4 mg total) by mouth every 8 (eight) hours as needed for nausea or vomiting., Disp: 20 tablet, Rfl: 0   Allergies  Allergen Reactions   Bactrim Other (See Comments)    Childhood allergy    Past Medical History:  Diagnosis Date   Asthma      Past Surgical History:  Procedure Laterality Date   ABDOMINAL SURGERY      History reviewed. No pertinent family history.  Social History   Tobacco Use   Smoking status: Former   Smokeless tobacco: Never  Building services engineer Use: Never used  Substance Use Topics   Alcohol use: Not Currently   Drug use: Yes    Frequency: 3.0 times per week    Types: Marijuana    ROS   Objective:   Vitals: BP 117/81 (BP Location: Left Arm)   Pulse 67   Temp 98.2 F (36.8 C) (Oral)   Resp 16   SpO2 98%   Physical Exam Constitutional:      General:  He is not in acute distress.    Appearance: Normal appearance. He is well-developed and normal weight. He is not ill-appearing, toxic-appearing or diaphoretic.  HENT:     Head: Normocephalic and atraumatic.     Right Ear: External ear normal.     Left Ear: External ear normal.     Nose: Nose normal.     Mouth/Throat:     Pharynx: Oropharynx is clear.  Eyes:     General: No scleral icterus.       Right eye: No discharge.        Left eye: No discharge.     Extraocular Movements: Extraocular movements intact.  Cardiovascular:     Rate and Rhythm: Normal rate.  Pulmonary:     Effort: Pulmonary effort is normal.  Musculoskeletal:     Cervical back: Normal range of motion.     Right knee: Bony tenderness present. No swelling, deformity, effusion, erythema, ecchymosis, lacerations or crepitus. Normal range of motion. Tenderness present over the medial joint line and patellar tendon. No lateral joint line tenderness. Normal alignment and normal patellar mobility.     Right foot: Decreased range of motion. Normal capillary refill. Tenderness and bony tenderness (right heel) present. No swelling, deformity, laceration or crepitus.  Neurological:     Mental Status: He  is alert and oriented to person, place, and time.  Psychiatric:        Mood and Affect: Mood normal.        Behavior: Behavior normal.        Thought Content: Thought content normal.        Judgment: Judgment normal.    DG Os Calcis Right  Result Date: 04/12/2023 CLINICAL DATA:  Posttraumatic right heel pain. EXAM: RIGHT OS CALCIS - 2+ VIEW COMPARISON:  None Available. FINDINGS: Lateral and Harris views of the calcaneus are submitted. The entire foot is not imaged. There is no evidence of acute fracture or dislocation. The joint spaces appear preserved. The soft tissues appear unremarkable. IMPRESSION: No evidence of acute calcaneal fracture or dislocation. Electronically Signed   By: Carey Bullocks M.D.   On: 04/12/2023 15:48    DG Knee AP/LAT W/Sunrise Right  Result Date: 04/12/2023 CLINICAL DATA:  Pain EXAM: RIGHT KNEE 3 VIEWS COMPARISON:  None Available. FINDINGS: No evidence of fracture, dislocation, or joint effusion. No evidence of arthropathy or other focal bone abnormality. Soft tissues are unremarkable. IMPRESSION: No fracture or dislocation is seen in right knee. Electronically Signed   By: Ernie Avena M.D.   On: 04/12/2023 15:47    I applied an 4" Ace wrap to the right knee.   Assessment and Plan :   PDMP not reviewed this encounter.  1. Contusion of right foot, initial encounter   2. Knee strain, right, initial encounter    Recommended conservative management for right foot contusion, right knee strain.  Use RICE method, naproxen for pain and inflammation. Counseled patient on potential for adverse effects with medications prescribed/recommended today, ER and return-to-clinic precautions discussed, patient verbalized understanding.    Wallis Bamberg, PA-C 04/12/23 1553

## 2023-08-16 ENCOUNTER — Ambulatory Visit
Admission: RE | Admit: 2023-08-16 | Discharge: 2023-08-16 | Disposition: A | Discharge status: Home / Self Care | Payer: Medicaid Other | Source: Ambulatory Visit | Attending: Internal Medicine | Admitting: Internal Medicine

## 2023-08-16 VITALS — BP 131/79 | HR 55 | Temp 98.1°F | Resp 16

## 2023-08-16 DIAGNOSIS — Z202 Contact with and (suspected) exposure to infections with a predominantly sexual mode of transmission: Secondary | ICD-10-CM

## 2023-08-16 MED ORDER — METRONIDAZOLE 500 MG PO TABS: 500.0000 mg | ORAL_TABLET | Freq: Two times a day (BID) | ORAL | 0 refills | Status: DC

## 2023-08-16 NOTE — Discharge Instructions (Addendum)
If you want to be tested again in the future to make sure you cleared the infection, you need to wait 5 weeks before coming back.

## 2023-08-16 NOTE — ED Provider Notes (Signed)
[facility]  Note:  This document was prepared using Conservation officer, historic buildings and may include unintentional dictation errors.  MRN: [id number] DOB: April 05, 1991  Subjective:   Paul Marshall is a 32 y.o. male presenting for treatment for trichomoniasis.  Patient presents with his wife who I also saw in our clinic just a few days ago.  She actually tested positive for trichomonas.  Patient himself does not want to be tested.  Insists on monogamy.  Denies dysuria, hematuria, urinary frequency, penile discharge, penile swelling, testicular pain, testicular swelling, anal pain, groin pain.   No current facility-administered medications for this encounter.  Current Outpatient Medications:    ibuprofen (ADVIL) 400 MG tablet, Take 1 tablet (400 mg total) by mouth every 8 (eight) hours as needed for up to 30 doses., Disp: 30 tablet, Rfl: 0   loperamide (IMODIUM A-D) 2 MG tablet, Take 1 tablet (2 mg total) by mouth 4 (four) times daily as needed for diarrhea or loose stools., Disp: 30 tablet, Rfl: 0   naproxen (NAPROSYN) 500 MG tablet, Take 1 tablet (500 mg total) by mouth 2 (two) times daily with a meal., Disp: 30 tablet, Rfl: 0   ondansetron (ZOFRAN-ODT) 4 MG disintegrating tablet, Take 1 tablet (4 mg total) by mouth every 8 (eight) hours as needed for nausea or vomiting., Disp: 20 tablet, Rfl: 0   Allergies  Allergen Reactions   Bactrim Other (See Comments)    Childhood allergy    Past Medical History:  Diagnosis Date   Asthma      Past Surgical History:  Procedure Laterality Date   ABDOMINAL SURGERY      No family history on file.  Social History   Tobacco Use   Smoking status: Former    Types: Cigarettes   Smokeless tobacco: Never  Vaping Use   Vaping status: Never Used  Substance Use Topics   Alcohol use: Not Currently   Drug use: Yes    Types: Marijuana    ROS   Objective:   Vitals: BP 131/79 (BP Location: Right Arm)   Pulse (!)  55   Temp 98.1 F (36.7 C) (Oral)   Resp 16   SpO2 97%   Physical Exam Constitutional:      General: He is not in acute distress.    Appearance: Normal appearance. He is well-developed and normal weight. He is not ill-appearing, toxic-appearing or diaphoretic.  HENT:     Head: Normocephalic and atraumatic.     Right Ear: External ear normal.     Left Ear: External ear normal.     Nose: Nose normal.     Mouth/Throat:     Pharynx: Oropharynx is clear.  Eyes:     General: No scleral icterus.       Right eye: No discharge.        Left eye: No discharge.     Extraocular Movements: Extraocular movements intact.  Cardiovascular:     Rate and Rhythm: Normal rate.  Pulmonary:     Effort: Pulmonary effort is normal.  Musculoskeletal:     Cervical back: Normal range of motion.  Neurological:     Mental Status: He is alert and oriented to person, place, and time.  Psychiatric:        Mood and Affect: Mood normal.        Behavior: Behavior normal.        Thought Content: Thought content normal.        Judgment:  Judgment normal.    Assessment and Plan :   PDMP not reviewed this encounter.  1. Trichomonas exposure    Advised agreeable to cover for trichomonas empirically given that I directly treated his wife in our clinic.  Counseled on safe sex practices including abstaining for 1 week following treatment.  Counseled patient on potential for adverse effects with medications prescribed/recommended today, ER and return-to-clinic precautions discussed, patient verbalized understanding.    Wallis Bamberg, PA-C 08/16/23 1336

## 2023-08-16 NOTE — ED Triage Notes (Signed)
Pt requesting med for +trichomonas-states he does not want further STD testing-NAD-steady gait

## 2023-11-20 ENCOUNTER — Emergency Department (HOSPITAL_COMMUNITY)
Admission: EM | Admit: 2023-11-20 | Discharge: 2023-11-20 | Disposition: A | Discharge status: Home / Self Care | Payer: Medicaid Other | Attending: Emergency Medicine | Admitting: Emergency Medicine

## 2023-11-20 ENCOUNTER — Emergency Department (HOSPITAL_COMMUNITY): Payer: Medicaid Other

## 2023-11-20 ENCOUNTER — Other Ambulatory Visit: Payer: Self-pay

## 2023-11-20 ENCOUNTER — Encounter (HOSPITAL_COMMUNITY): Payer: Self-pay

## 2023-11-20 DIAGNOSIS — S0003XA Contusion of scalp, initial encounter: Secondary | ICD-10-CM | POA: Diagnosis not present

## 2023-11-20 DIAGNOSIS — S0011XA Contusion of right eyelid and periocular area, initial encounter: Secondary | ICD-10-CM | POA: Diagnosis not present

## 2023-11-20 DIAGNOSIS — S0083XA Contusion of other part of head, initial encounter: Secondary | ICD-10-CM | POA: Diagnosis not present

## 2023-11-20 DIAGNOSIS — R220 Localized swelling, mass and lump, head: Secondary | ICD-10-CM | POA: Diagnosis not present

## 2023-11-20 DIAGNOSIS — M25572 Pain in left ankle and joints of left foot: Secondary | ICD-10-CM | POA: Diagnosis present

## 2023-11-20 DIAGNOSIS — S0990XA Unspecified injury of head, initial encounter: Secondary | ICD-10-CM | POA: Diagnosis not present

## 2023-11-20 DIAGNOSIS — S93492A Sprain of other ligament of left ankle, initial encounter: Secondary | ICD-10-CM | POA: Diagnosis not present

## 2023-11-20 DIAGNOSIS — K029 Dental caries, unspecified: Secondary | ICD-10-CM | POA: Diagnosis not present

## 2023-11-20 MED ORDER — ACETAMINOPHEN 325 MG PO TABS
650.0000 mg | ORAL_TABLET | Freq: Once | ORAL | Status: AC
  Administered 2023-11-20: 650 mg via ORAL
  Filled 2023-11-20: qty 2

## 2023-11-20 MED ORDER — METHOCARBAMOL 500 MG PO TABS: 500.0000 mg | ORAL_TABLET | Freq: Two times a day (BID) | ORAL | 0 refills | Status: AC

## 2023-11-20 NOTE — Progress Notes (Signed)
Orthopedic Tech Progress Note Patient Details:  Paul Marshall 13-May-1991 [id number]  Ortho Devices Type of Ortho Device: ASO Ortho Device/Splint Location: LLE Ortho Device/Splint Interventions: Ordered, Application, Adjustment   Post Interventions Patient Tolerated: Fair, Well Instructions Provided: Care of device  Donald Pore 11/20/2023, 1:49 PM
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
## 2023-11-20 NOTE — Discharge Instructions (Addendum)
Please use Tylenol or ibuprofen for pain.  You may use 600 mg ibuprofen every 6 hours or 1000 mg of Tylenol every 6 hours.  You may choose to alternate between the 2.  This would be most effective.  Not to exceed 4 g of Tylenol within 24 hours.  Not to exceed 3200 mg ibuprofen 24 hours.  You can use the muscle relaxant I am prescribing in addition to the above to help with any breakthrough pain.  You can take it up to twice daily.  It is safe to take at night, but I would be cautious taking it during the day as it can cause some drowsiness.  Make sure that you are feeling awake and alert before you get behind the wheel of a car or operate a motor vehicle.  It is not a narcotic pain medication so you are able to take it if it is not making you drowsy and still pilot a vehicle or machinery safely.  Can apply ice for 15 minutes at a time up to twice an hour of the left ankle.  You can use the ankle brace as needed for the next 1 to 2 weeks.  You can use the rehab exercises I have provided above to return to normal activity of the left ankle over time.  Keeping the leg elevated while you are not on it is helpful with swelling and pain.
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
